# Patient Record
Sex: Female | Born: 1935 | Race: White | Hispanic: No | Marital: Married | Smoking: Never smoker
Health system: Southern US, Community
[De-identification: ages and names within clinical notes are randomized; demographics above are authoritative.]

## PROBLEM LIST (undated history)

## (undated) DIAGNOSIS — G9341 Metabolic encephalopathy: Secondary | ICD-10-CM

## (undated) DIAGNOSIS — I6930 Unspecified sequelae of cerebral infarction: Secondary | ICD-10-CM

## (undated) DIAGNOSIS — U071 COVID-19: Secondary | ICD-10-CM

## (undated) DIAGNOSIS — I69891 Dysphagia following other cerebrovascular disease: Secondary | ICD-10-CM

## (undated) DIAGNOSIS — I10 Essential (primary) hypertension: Secondary | ICD-10-CM

## (undated) DIAGNOSIS — N3090 Cystitis, unspecified without hematuria: Secondary | ICD-10-CM

## (undated) DIAGNOSIS — R2689 Other abnormalities of gait and mobility: Secondary | ICD-10-CM

## (undated) DIAGNOSIS — R2681 Unsteadiness on feet: Secondary | ICD-10-CM

## (undated) DIAGNOSIS — R278 Other lack of coordination: Secondary | ICD-10-CM

---

## 2019-10-14 ENCOUNTER — Inpatient Hospital Stay (HOSPITAL_COMMUNITY): Payer: Medicare PPO

## 2019-10-14 ENCOUNTER — Encounter (HOSPITAL_COMMUNITY): Payer: Self-pay | Admitting: *Deleted

## 2019-10-14 ENCOUNTER — Emergency Department (HOSPITAL_COMMUNITY): Payer: Medicare PPO

## 2019-10-14 ENCOUNTER — Inpatient Hospital Stay (HOSPITAL_COMMUNITY)
Admission: EM | Admit: 2019-10-14 | Discharge: 2019-10-19 | DRG: 177 | Disposition: A | Discharge status: Skilled Nursing Facility | Payer: Medicare PPO | Attending: Internal Medicine | Admitting: Internal Medicine

## 2019-10-14 DIAGNOSIS — Z7902 Long term (current) use of antithrombotics/antiplatelets: Secondary | ICD-10-CM

## 2019-10-14 DIAGNOSIS — F0151 Vascular dementia with behavioral disturbance: Secondary | ICD-10-CM | POA: Diagnosis not present

## 2019-10-14 DIAGNOSIS — Z66 Do not resuscitate: Secondary | ICD-10-CM | POA: Diagnosis present

## 2019-10-14 DIAGNOSIS — I69391 Dysphagia following cerebral infarction: Secondary | ICD-10-CM

## 2019-10-14 DIAGNOSIS — I69891 Dysphagia following other cerebrovascular disease: Secondary | ICD-10-CM | POA: Diagnosis not present

## 2019-10-14 DIAGNOSIS — B379 Candidiasis, unspecified: Secondary | ICD-10-CM | POA: Diagnosis not present

## 2019-10-14 DIAGNOSIS — J1282 Pneumonia due to coronavirus disease 2019: Secondary | ICD-10-CM | POA: Diagnosis present

## 2019-10-14 DIAGNOSIS — J189 Pneumonia, unspecified organism: Secondary | ICD-10-CM | POA: Diagnosis not present

## 2019-10-14 DIAGNOSIS — F0150 Vascular dementia without behavioral disturbance: Secondary | ICD-10-CM | POA: Diagnosis not present

## 2019-10-14 DIAGNOSIS — F0390 Unspecified dementia without behavioral disturbance: Secondary | ICD-10-CM | POA: Diagnosis present

## 2019-10-14 DIAGNOSIS — G9340 Encephalopathy, unspecified: Secondary | ICD-10-CM | POA: Diagnosis not present

## 2019-10-14 DIAGNOSIS — F0280 Dementia in other diseases classified elsewhere without behavioral disturbance: Secondary | ICD-10-CM | POA: Diagnosis present

## 2019-10-14 DIAGNOSIS — Z79899 Other long term (current) drug therapy: Secondary | ICD-10-CM | POA: Diagnosis not present

## 2019-10-14 DIAGNOSIS — Z792 Long term (current) use of antibiotics: Secondary | ICD-10-CM | POA: Diagnosis not present

## 2019-10-14 DIAGNOSIS — U071 COVID-19: Principal | ICD-10-CM | POA: Diagnosis present

## 2019-10-14 DIAGNOSIS — N179 Acute kidney failure, unspecified: Secondary | ICD-10-CM | POA: Diagnosis present

## 2019-10-14 DIAGNOSIS — G309 Alzheimer's disease, unspecified: Secondary | ICD-10-CM | POA: Diagnosis present

## 2019-10-14 DIAGNOSIS — J9601 Acute respiratory failure with hypoxia: Secondary | ICD-10-CM | POA: Diagnosis present

## 2019-10-14 DIAGNOSIS — Z515 Encounter for palliative care: Secondary | ICD-10-CM | POA: Diagnosis present

## 2019-10-14 DIAGNOSIS — R52 Pain, unspecified: Secondary | ICD-10-CM

## 2019-10-14 DIAGNOSIS — B961 Klebsiella pneumoniae [K. pneumoniae] as the cause of diseases classified elsewhere: Secondary | ICD-10-CM | POA: Diagnosis not present

## 2019-10-14 DIAGNOSIS — I69351 Hemiplegia and hemiparesis following cerebral infarction affecting right dominant side: Secondary | ICD-10-CM

## 2019-10-14 DIAGNOSIS — N184 Chronic kidney disease, stage 4 (severe): Secondary | ICD-10-CM | POA: Diagnosis present

## 2019-10-14 DIAGNOSIS — I129 Hypertensive chronic kidney disease with stage 1 through stage 4 chronic kidney disease, or unspecified chronic kidney disease: Secondary | ICD-10-CM | POA: Diagnosis present

## 2019-10-14 DIAGNOSIS — G9341 Metabolic encephalopathy: Secondary | ICD-10-CM | POA: Diagnosis present

## 2019-10-14 HISTORY — DX: Metabolic encephalopathy: G93.41

## 2019-10-14 HISTORY — DX: Dysphagia following other cerebrovascular disease: I69.891

## 2019-10-14 HISTORY — DX: Other abnormalities of gait and mobility: R26.89

## 2019-10-14 HISTORY — DX: Unspecified sequelae of cerebral infarction: I69.30

## 2019-10-14 HISTORY — DX: Cystitis, unspecified without hematuria: N30.90

## 2019-10-14 HISTORY — DX: Essential (primary) hypertension: I10

## 2019-10-14 HISTORY — DX: Other lack of coordination: R27.8

## 2019-10-14 HISTORY — DX: Unsteadiness on feet: R26.81

## 2019-10-14 HISTORY — DX: COVID-19: U07.1

## 2019-10-14 LAB — CBC WITH DIFFERENTIAL/PLATELET
Abs Immature Granulocytes: 0.03 10*3/uL (ref 0.00–0.07)
Basophils Absolute: 0 10*3/uL (ref 0.0–0.1)
Basophils Relative: 0 %
Eosinophils Absolute: 0 10*3/uL (ref 0.0–0.5)
Eosinophils Relative: 0 %
HCT: 37.2 % (ref 36.0–46.0)
Hemoglobin: 10.9 g/dL — ABNORMAL LOW (ref 12.0–15.0)
Immature Granulocytes: 1 %
Lymphocytes Relative: 8 %
Lymphs Abs: 0.5 10*3/uL — ABNORMAL LOW (ref 0.7–4.0)
MCH: 27.9 pg (ref 26.0–34.0)
MCHC: 29.3 g/dL — ABNORMAL LOW (ref 30.0–36.0)
MCV: 95.4 fL (ref 80.0–100.0)
Monocytes Absolute: 0.2 10*3/uL (ref 0.1–1.0)
Monocytes Relative: 3 %
Neutro Abs: 5.7 10*3/uL (ref 1.7–7.7)
Neutrophils Relative %: 88 %
Platelets: 218 10*3/uL (ref 150–400)
RBC: 3.9 MIL/uL (ref 3.87–5.11)
RDW: 14.3 % (ref 11.5–15.5)
WBC: 6.5 10*3/uL (ref 4.0–10.5)
nRBC: 0 % (ref 0.0–0.2)

## 2019-10-14 LAB — D-DIMER, QUANTITATIVE: D-Dimer, Quant: 2.52 ug/mL-FEU — ABNORMAL HIGH (ref 0.00–0.50)

## 2019-10-14 LAB — COMPREHENSIVE METABOLIC PANEL
ALT: 16 U/L (ref 0–44)
AST: 31 U/L (ref 15–41)
Albumin: 2.6 g/dL — ABNORMAL LOW (ref 3.5–5.0)
Alkaline Phosphatase: 72 U/L (ref 38–126)
Anion gap: 10 (ref 5–15)
BUN: 44 mg/dL — ABNORMAL HIGH (ref 8–23)
CO2: 25 mmol/L (ref 22–32)
Calcium: 8.8 mg/dL — ABNORMAL LOW (ref 8.9–10.3)
Chloride: 103 mmol/L (ref 98–111)
Creatinine, Ser: 1.97 mg/dL — ABNORMAL HIGH (ref 0.44–1.00)
GFR calc Af Amer: 25 mL/min — ABNORMAL LOW (ref >60)
GFR calc non Af Amer: 22 mL/min — ABNORMAL LOW (ref >60)
Glucose, Bld: 151 mg/dL — ABNORMAL HIGH (ref 70–99)
Potassium: 4.5 mmol/L (ref 3.5–5.1)
Sodium: 138 mmol/L (ref 135–145)
Total Bilirubin: 0.5 mg/dL (ref 0.3–1.2)
Total Protein: 6.6 g/dL (ref 6.5–8.1)

## 2019-10-14 LAB — C-REACTIVE PROTEIN: CRP: 2.6 mg/dL — ABNORMAL HIGH (ref <1.0)

## 2019-10-14 LAB — CBC
HCT: 35.8 % — ABNORMAL LOW (ref 36.0–46.0)
Hemoglobin: 10.5 g/dL — ABNORMAL LOW (ref 12.0–15.0)
MCH: 28.1 pg (ref 26.0–34.0)
MCHC: 29.3 g/dL — ABNORMAL LOW (ref 30.0–36.0)
MCV: 95.7 fL (ref 80.0–100.0)
Platelets: 198 10*3/uL (ref 150–400)
RBC: 3.74 MIL/uL — ABNORMAL LOW (ref 3.87–5.11)
RDW: 14.1 % (ref 11.5–15.5)
WBC: 4.3 10*3/uL (ref 4.0–10.5)
nRBC: 0 % (ref 0.0–0.2)

## 2019-10-14 LAB — FERRITIN: Ferritin: 150 ng/mL (ref 11–307)

## 2019-10-14 LAB — BLOOD GAS, ARTERIAL
Acid-base deficit: 0.2 mmol/L (ref 0.0–2.0)
Bicarbonate: 24.7 mmol/L (ref 20.0–28.0)
O2 Saturation: 99.2 %
Patient temperature: 98.6
pCO2 arterial: 44.5 mmHg (ref 32.0–48.0)
pH, Arterial: 7.364 (ref 7.350–7.450)
pO2, Arterial: 150 mmHg — ABNORMAL HIGH (ref 83.0–108.0)

## 2019-10-14 LAB — PROCALCITONIN: Procalcitonin: <0.1 ng/mL

## 2019-10-14 LAB — CREATININE, SERUM
Creatinine, Ser: 2.16 mg/dL — ABNORMAL HIGH (ref 0.44–1.00)
GFR calc Af Amer: 23 mL/min — ABNORMAL LOW (ref >60)
GFR calc non Af Amer: 20 mL/min — ABNORMAL LOW (ref >60)

## 2019-10-14 LAB — FIBRINOGEN: Fibrinogen: 552 mg/dL — ABNORMAL HIGH (ref 210–475)

## 2019-10-14 LAB — POC SARS CORONAVIRUS 2 AG -  ED: SARS Coronavirus 2 Ag: POSITIVE — AB

## 2019-10-14 LAB — LACTIC ACID, PLASMA: Lactic Acid, Venous: 1.5 mmol/L (ref 0.5–1.9)

## 2019-10-14 LAB — TRIGLYCERIDES: Triglycerides: 145 mg/dL (ref <150)

## 2019-10-14 LAB — LACTATE DEHYDROGENASE: LDH: 219 U/L — ABNORMAL HIGH (ref 98–192)

## 2019-10-14 MED ORDER — AZITHROMYCIN 250 MG PO TABS
500.0000 mg | ORAL_TABLET | Freq: Once | ORAL | Status: DC
  Filled 2019-10-14: qty 2

## 2019-10-14 MED ORDER — ACETAMINOPHEN 325 MG PO TABS: 650.0000 mg | ORAL_TABLET | Freq: Four times a day (QID) | ORAL | Status: DC | PRN

## 2019-10-14 MED ORDER — SODIUM CHLORIDE 0.9 % IV SOLN: 100.0000 mg | Freq: Every day | INTRAVENOUS | Status: DC

## 2019-10-14 MED ORDER — ALBUTEROL SULFATE HFA 108 (90 BASE) MCG/ACT IN AERS
2.0000 | INHALATION_SPRAY | Freq: Four times a day (QID) | RESPIRATORY_TRACT | Status: DC
  Administered 2019-10-14 – 2019-10-19 (×17): 2 via RESPIRATORY_TRACT
  Filled 2019-10-14 (×2): qty 6.7

## 2019-10-14 MED ORDER — ASCORBIC ACID 500 MG PO TABS
500.0000 mg | ORAL_TABLET | Freq: Every day | ORAL | Status: DC
  Administered 2019-10-14 – 2019-10-19 (×6): 500 mg via ORAL
  Filled 2019-10-14 (×6): qty 1

## 2019-10-14 MED ORDER — MIRTAZAPINE 15 MG PO TABS
7.5000 mg | ORAL_TABLET | Freq: Every day | ORAL | Status: DC
  Administered 2019-10-14 – 2019-10-18 (×5): 7.5 mg via ORAL
  Filled 2019-10-14 (×5): qty 1

## 2019-10-14 MED ORDER — DEXAMETHASONE SODIUM PHOSPHATE 10 MG/ML IJ SOLN
6.0000 mg | Freq: Once | INTRAMUSCULAR | Status: AC
  Administered 2019-10-14: 6 mg via INTRAVENOUS
  Filled 2019-10-14: qty 1

## 2019-10-14 MED ORDER — SODIUM CHLORIDE 0.9 % IV SOLN
1.0000 g | INTRAVENOUS | Status: DC
  Administered 2019-10-14 – 2019-10-16 (×3): 1 g via INTRAVENOUS
  Filled 2019-10-14 (×3): qty 10

## 2019-10-14 MED ORDER — DEXTROSE-NACL 5-0.45 % IV SOLN: INTRAVENOUS | Status: AC

## 2019-10-14 MED ORDER — DEXAMETHASONE SODIUM PHOSPHATE 10 MG/ML IJ SOLN
6.0000 mg | INTRAMUSCULAR | Status: DC
  Administered 2019-10-14 – 2019-10-18 (×5): 6 mg via INTRAVENOUS
  Filled 2019-10-14 (×5): qty 1

## 2019-10-14 MED ORDER — SODIUM CHLORIDE 0.9 % IV SOLN
1.0000 g | Freq: Once | INTRAVENOUS | Status: AC
  Administered 2019-10-14: 1 g via INTRAVENOUS
  Filled 2019-10-14: qty 10

## 2019-10-14 MED ORDER — ENOXAPARIN SODIUM 30 MG/0.3ML ~~LOC~~ SOLN
30.0000 mg | SUBCUTANEOUS | Status: DC
  Administered 2019-10-14 – 2019-10-16 (×3): 30 mg via SUBCUTANEOUS
  Filled 2019-10-14 (×3): qty 0.3

## 2019-10-14 MED ORDER — IOHEXOL 350 MG/ML SOLN: 100.0000 mL | Freq: Once | INTRAVENOUS | Status: DC | PRN

## 2019-10-14 MED ORDER — SODIUM CHLORIDE 0.9 % IV SOLN
100.0000 mg | Freq: Every day | INTRAVENOUS | Status: AC
  Administered 2019-10-15 – 2019-10-18 (×4): 100 mg via INTRAVENOUS
  Filled 2019-10-14: qty 100
  Filled 2019-10-14 (×3): qty 20

## 2019-10-14 MED ORDER — ZINC SULFATE 220 (50 ZN) MG PO CAPS
220.0000 mg | ORAL_CAPSULE | Freq: Every day | ORAL | Status: DC
  Administered 2019-10-14 – 2019-10-19 (×6): 220 mg via ORAL
  Filled 2019-10-14 (×6): qty 1

## 2019-10-14 MED ORDER — SODIUM CHLORIDE 0.9 % IV SOLN: 200.0000 mg | Freq: Once | INTRAVENOUS | Status: DC

## 2019-10-14 MED ORDER — SODIUM CHLORIDE 0.9 % IV SOLN
200.0000 mg | Freq: Once | INTRAVENOUS | Status: AC
  Administered 2019-10-14: 200 mg via INTRAVENOUS
  Filled 2019-10-14: qty 200

## 2019-10-14 MED ORDER — SERTRALINE HCL 50 MG PO TABS
50.0000 mg | ORAL_TABLET | Freq: Every day | ORAL | Status: DC
  Administered 2019-10-14 – 2019-10-19 (×6): 50 mg via ORAL
  Filled 2019-10-14 (×6): qty 1

## 2019-10-14 MED ORDER — SODIUM CHLORIDE 0.9 % IV SOLN
500.0000 mg | INTRAVENOUS | Status: DC
  Administered 2019-10-14 – 2019-10-16 (×3): 500 mg via INTRAVENOUS
  Filled 2019-10-14 (×3): qty 500

## 2019-10-14 NOTE — ED Notes (Signed)
[phone] daughter would like an update

## 2019-10-14 NOTE — H&P (Addendum)
History and Physical        Hospital Admission Note Date: 10/14/2019  Patient name: Tammy Wood Medical record number: [id number] Date of birth: 09/30/1926 Age: 86 y.o. Gender: female  PCP: Seward Carol, MD    Patient coming from: [facility] skilled nursing facility  I have reviewed all records in the [facility].    Chief Complaint:  Decreased oral intake, coughing, Covid positive  HPI: Patient is a 86 year old female with history of recent CVA in 07/2019 (left basal ganglia infarct, received TPA, admitted at [facility]) residual right-sided weakness, dysphagia, essential hypertension, arthritis, then had a UTI in 08/2019.  She has been at [facility] facility since 09/10/2019.  History was obtained from the patient's daughter.  Patient's daughter reported that patient got exposed and contracted Covid at the facility.  She did not have any fevers, loss of smell or taste.  She did have coughing and decreased p.o. intake.  Patient had not been eating or drinking much hence she was started on IV fluids at the facility yesterday.   Patient was sent to ED for Covid pneumonia. At the time of my encounter, somnolent, difficult to arouse.  Vital signs on the monitor stable. ABG showed pH of 7.3, PCO2 44.5, PO2 150  ED work-up/course:  In ED temp 98.6, respiratory rate 19, BP 110/46, O2 sats 96% on 2 L Sodium 138, potassium 4.5, creatinine 1.97, BUN 44 Creatinine 1.5 on 08/29/2019 per [facility] records  COVID-19 positive  Procalcitonin less than 0.1, CRP 2.6, ferritin 150, LDH 219, D-dimer 2.52, fibrinogen 552  Review of Systems: Positives marked in 'bold' Unable to obtain review of system from the patient due to her mental status, dementia  Past Medical History: Past Medical History:  Diagnosis Date  . Clinical diagnosis of severe  acute respiratory syndrome coronavirus 2 (SARS-CoV-2) disease   . Diverticulitis of bladder   . Dysphagia following other cerebrovascular disease   . Essential hypertension, malignant   . Metabolic encephalopathy   . Muscular incoordination   . Scissor gait   . Sequelae of cerebral infarction   . Unsteady     History reviewed. No pertinent surgical history.  Medications: Prior to Admission medications   Medication Sig Start Date End Date Taking? Authorizing Provider  acetaminophen (TYLENOL) 325 MG tablet Take 650 mg by mouth every 6 (six) hours as needed for mild pain.   Yes [provider]  albuterol (VENTOLIN HFA) 108 (90 Base) MCG/ACT inhaler Inhale 2 puffs into the lungs every 6 (six) hours as needed for wheezing or shortness of breath.   Yes [provider]  amLODipine (NORVASC) 10 MG tablet Take 10 mg by mouth daily.   Yes [provider]  ascorbic acid (VITAMIN C) 250 MG tablet Take 500 mg by mouth 2 (two) times daily.   Yes [provider]  carvedilol (COREG) 12.5 MG tablet Take 12.5 mg by mouth 2 (two) times daily with a meal.   Yes [provider]  Cholecalciferol (VITAMIN D3) 25 MCG (1000 UT) CAPS Take 1 capsule by mouth daily.   Yes [provider]  dexamethasone (DECADRON) 6 MG tablet Take  6 mg by mouth daily. x10days started 1.15.21   Yes [provider]  gabapentin (NEURONTIN) 100 MG capsule Take 100 mg by mouth daily.   Yes [provider]  Infant Care Products (DERMACLOUD) CREA Apply 1 application topically 2 (two) times daily. Apply to buttocks after incontinence episode.   Yes [provider]  mirtazapine (REMERON) 7.5 MG tablet Take 7.5 mg by mouth at bedtime.   Yes [provider]  ondansetron (ZOFRAN-ODT) 4 MG disintegrating tablet Take 4 mg by mouth every 6 (six) hours as needed for nausea or vomiting.   Yes [provider]  rosuvastatin (CRESTOR) 5 MG tablet Take 5 mg  by mouth daily.   Yes [provider]  sertraline (ZOLOFT) 50 MG tablet Take 50 mg by mouth daily.   Yes [provider]  zinc gluconate 50 MG tablet Take 100 mg by mouth daily.   Yes [provider]  azithromycin (ZITHROMAX) 250 MG tablet Take 250 mg by mouth daily. One time only.    [provider]  clopidogrel (PLAVIX) 75 MG tablet Take 75 mg by mouth daily.    [provider]    Allergies:   Allergies  Allergen Reactions  . Aspartame Tinitus    ARTIFICIAL SWEETNERS "CAUSE MY EARS TO ROAR" ARTIFICIAL SWEETNERS "CAUSE MY EARS TO ROAR" ARTIFICIAL SWEETNERS "CAUSE MY EARS TO ROAR" ARTIFICIAL SWEETNERS "CAUSE MY EARS TO ROAR"   . Aspirin Tinitus  . Chocolate Tinitus  . Cocoa Tinitus  . Atorvastatin Other (See Comments)  . Niacin Other (See Comments)    Other reaction(s): Unknown  . Sulfa Antibiotics     Other reaction(s): Unknown  . Sulfasalazine Other (See Comments)  . Amoxicillin-Pot Clavulanate Diarrhea  . Doxycycline Diarrhea and Nausea Only    Social History:  has no history on file for tobacco, alcohol, and drug.  Family History: History reviewed. No pertinent family history.  Physical Exam: Blood pressure (!) 110/46, pulse 61, temperature 98.6 F (37 C), temperature source Oral, resp. rate 19, SpO2 96 %. General: Somnolent, difficult to arouse Eyes: pink conjunctiva,anicteric sclera, PERLA, HEENT: normocephalic, atraumatic, oropharynx clear Neck: supple, no masses or lymphadenopathy, no goiter, no bruits, no JVD CVS: Regular rate and rhythm, without murmurs, rubs or gallops. No lower extremity edema Resp : Decreased breath sound at the bases GI : Soft, nontender, nondistended, positive bowel sounds, no masses. No hepatomegaly. No hernia.  Musculoskeletal: No clubbing or cyanosis, positive pedal pulses. No contracture. ROM intact  Neuro: Not following commands, somnolent Psych: Somnolent difficult to arouse Skin: no  rashes or lesions, warm and dry   LABS on Admission: I have personally reviewed all the labs and imagings below    Basic Metabolic Panel: Recent Labs  Lab 10/14/19 1237  NA 138  K 4.5  CL 103  CO2 25  GLUCOSE 151*  BUN 44*  CREATININE 1.97*  CALCIUM 8.8*   Liver Function Tests: Recent Labs  Lab 10/14/19 1237  AST 31  ALT 16  ALKPHOS 72  BILITOT 0.5  PROT 6.6  ALBUMIN 2.6*   No results for input(s): LIPASE, AMYLASE in the last 168 hours. No results for input(s): AMMONIA in the last 168 hours. CBC: Recent Labs  Lab 10/14/19 1237  WBC 6.5  NEUTROABS 5.7  HGB 10.9*  HCT 37.2  MCV 95.4  PLT 218   Cardiac Enzymes: No results for input(s): CKTOTAL, CKMB, CKMBINDEX, TROPONINI in the last 168 hours. BNP: Invalid input(s): POCBNP CBG: No results  for input(s): GLUCAP in the last 168 hours.  Radiological Exams on Admission:  CT HEAD WO CONTRAST  Result Date: 10/14/2019 CLINICAL DATA:  86 year old female with neurologic deficit. Concern for acute infarct. EXAM: CT HEAD WITHOUT CONTRAST TECHNIQUE: Contiguous axial images were obtained from the base of the skull through the vertex without intravenous contrast. COMPARISON:  None. FINDINGS: Brain: There is mild age-related atrophy and moderate chronic microvascular ischemic changes. An area of low attenuation in the left lentiform nucleus, likely chronic. Clinical correlation is recommended. Bilateral thalamic low attenuation with an area of decreased density along the inferior aspect of the left thalamus, age indeterminate, possibly chronic. Clinical correlation is recommended. MRI may provide better evaluation if there is high clinical concern for an acute infarct. There is no acute intracranial hemorrhage. No mass effect or midline shift. No extra-axial fluid collection. Vascular: No hyperdense vessel or unexpected calcification. Skull: Normal. Negative for fracture or focal lesion. Sinuses/Orbits: No acute finding. Other: None  IMPRESSION: 1. No acute intracranial hemorrhage. 2. Moderate chronic microvascular ischemic changes. Large left lentiform nucleus infarct, likely old. Clinical correlation is recommended. Electronically Signed   By: Anner Crete M.D.   On: 10/14/2019 16:04   DG Chest Port 1 View  Result Date: 10/14/2019 CLINICAL DATA:  Shortness of breath, cough. EXAM: PORTABLE CHEST 1 VIEW COMPARISON:  None. FINDINGS: The heart size and mediastinal contours are within normal limits. No pneumothorax or pleural effusion is noted. Mild patchy airspace opacities are noted in both lung bases concerning for multifocal pneumonia. The visualized skeletal structures are unremarkable. IMPRESSION: Mild patchy bibasilar airspace opacities are noted concerning for multifocal pneumonia. Electronically Signed   By: Marijo Conception M.D.   On: 10/14/2019 12:57      EKG: Independently reviewed.  Rate 63, normal sinus rhythm   Assessment/Plan Principal Problem:  Acute hypoxic respiratory failure due to acute COVID-19 viral pneumonia during the ongoing 2020 COVID-19 pandemic- POA - Patient presented with coughing, decreased oral intake, acute kidney injury, COVID-19 - Currently hypoxic, requiring 2 L nasal cannula -Started on Decadron 6 mg IV daily, Remdesivir per pharmacy protocol - Continue Supportive care: vitamin C/zinc, albuterol, Tylenol. - Continue to wean oxygen, ambulatory O2 screening daily as tolerated  - Oxygen - SpO2: 96 % O2 Flow Rate (L/min): 2 L/min - Continue to follow labs as below  No results found for: [place]  Lab 10/14/19 1237  DDIMER 2.52*  FERRITIN 150  CRP 2.6*  ALT 16  PROCALCITON <0.10      Active Problems:   Acute encephalopathy with underlying history of recent acute CVA, right-sided hemiparesis, dysphagia -At the time of my examination, somnolent, difficult to arouse -ABG did not show any respiratory acidosis, follow UA and culture -CT head showed no acute  intracranial hemorrhage, moderate chronic microvascular ischemic changes, large left lentiform nucleus infarct, likely old   Dysphagia -Poor oral intake in the last week, likely due to Covid, also has residual dysphagia from recent stroke -Per daughter, patient is on pure diet, honey thick -Given her mental status currently, will hold diet, SLP evaluation once alert and awake. -Placed on gentle IV fluid hydration  Acute kidney injury superimposed on CKD stage IV -Creatinine 1.5 on 08/29/19 per [facility] records -Placed on IV fluid hydration  DVT prophylaxis: Lovenox  CODE STATUS: DNR/DNI, per transfer records  Consults called: None  Family Communication: Admission, patients condition and plan of care including tests being ordered have been discussed with the patient's  daughter who indicates understanding and agree with the plan and Code Status  Admission status: Inpatient stepdown  The medical decision making on this patient was of high complexity and the patient is at high risk for clinical deterioration, therefore this is a level 3 admission.  Severity of Illness:      The appropriate patient status for this patient is INPATIENT. Inpatient status is judged to be reasonable and necessary in order to provide the required intensity of service to ensure the patient's safety. The patient's presenting symptoms, physical exam findings, and initial radiographic and laboratory data in the context of their chronic comorbidities is felt to place them at high risk for further clinical deterioration. Furthermore, it is not anticipated that the patient will be medically stable for discharge from the hospital within 2 midnights of admission. The following factors support the patient status of inpatient.   " The patient's presenting symptoms include altered mental status, acute kidney injury, Covid multifocal pneumonia, decreased oral intake " The worrisome physical exam findings include somnolent,  difficult to arouse " The initial radiographic and laboratory data are worrisome because of acute kidney injury " The chronic co-morbidities include recent CVA, residual hemiparesis, dysphagia   * I certify that at the point of admission it is my clinical judgment that the patient will require inpatient hospital care spanning beyond 2 midnights from the point of admission due to high intensity of service, high risk for further deterioration and high frequency of surveillance required.*    Time Spent on Admission: 70-minutes     Calhoun Reichardt M.D. [facility] 10/14/2019, 4:40 PM

## 2019-10-14 NOTE — ED Triage Notes (Signed)
Per EMS, pt from [facility] sent here for COVID PNA. She is at baseline, has no complaints. Pt was normal sinus rhythm on monitor. Hcx of stroke, has right sided deficits.

## 2019-10-14 NOTE — ED Provider Notes (Signed)
[facility] DEPT Provider Note   CSN: [id number] Arrival date & time: 10/14/19  1147     History   Tammy Wood is a 86 y.o. female.  HPI   Patient was sent to the ED for evaluation of respiratory difficulties associated with Covid infection.  Patient has history of stroke and right-sided deficits at baseline.  According to the EMS report the patient is a resident at [facility] facility.  She has been diagnosed with Covid pneumonia.  Family members wanted her sent to the ED for further treatment.  Patient is able to answer questions but is somewhat difficult to understand.  She denies having any chest pain.  She denies having any fevers.  Past Medical History:  Diagnosis Date  . Clinical diagnosis of severe acute respiratory syndrome coronavirus 2 (SARS-CoV-2) disease   . Diverticulitis of bladder   . Dysphagia following other cerebrovascular disease   . Essential hypertension, malignant   . Metabolic encephalopathy   . Muscular incoordination   . Scissor gait   . Sequelae of cerebral infarction   . Unsteady     There are no problems to display for this patient.   History reviewed. No pertinent surgical history.   OB History   No obstetric history on file.     History reviewed. No pertinent family history.  Social History   Tobacco Use  . Smoking status: Not on file  Substance Use Topics  . Alcohol use: Not on file  . Drug use: Not on file    Home Medications Prior to Admission medications   Medication Sig Start Date End Date Taking? Authorizing Provider  acetaminophen (TYLENOL) 325 MG tablet Take 650 mg by mouth every 6 (six) hours as needed for mild pain.   Yes [provider]  albuterol (VENTOLIN HFA) 108 (90 Base) MCG/ACT inhaler Inhale 2 puffs into the lungs every 6 (six) hours as needed for wheezing or shortness of breath.   Yes [provider]  amLODipine (NORVASC) 10 MG tablet Take 10 mg by mouth  daily.   Yes [provider]  ascorbic acid (VITAMIN C) 250 MG tablet Take 500 mg by mouth 2 (two) times daily.   Yes [provider]  carvedilol (COREG) 12.5 MG tablet Take 12.5 mg by mouth 2 (two) times daily with a meal.   Yes [provider]  Cholecalciferol (VITAMIN D3) 25 MCG (1000 UT) CAPS Take 1 capsule by mouth daily.   Yes [provider]  dexamethasone (DECADRON) 6 MG tablet Take 6 mg by mouth daily. x10days started 1.15.21   Yes [provider]  gabapentin (NEURONTIN) 100 MG capsule Take 100 mg by mouth daily.   Yes [provider]  Infant Care Products (DERMACLOUD) CREA Apply 1 application topically 2 (two) times daily. Apply to buttocks after incontinence episode.   Yes [provider]  mirtazapine (REMERON) 7.5 MG tablet Take 7.5 mg by mouth at bedtime.   Yes [provider]  ondansetron (ZOFRAN-ODT) 4 MG disintegrating tablet Take 4 mg by mouth every 6 (six) hours as needed for nausea or vomiting.   Yes [provider]  rosuvastatin (CRESTOR) 5 MG tablet Take 5 mg by mouth daily.   Yes [provider]  sertraline (ZOLOFT) 50 MG tablet Take 50 mg by mouth daily.   Yes [provider]  zinc gluconate 50 MG tablet Take 100 mg by mouth daily.   Yes [provider]  azithromycin (ZITHROMAX) 250 MG  tablet Take 250 mg by mouth daily. One time only.    [provider]  clopidogrel (PLAVIX) 75 MG tablet Take 75 mg by mouth daily.    [provider]    Allergies    Aspartame, Aspirin, Chocolate, Cocoa, Atorvastatin, Niacin, Sulfa antibiotics, Sulfasalazine, Amoxicillin-pot clavulanate, and Doxycycline  Review of Systems   Review of Systems  All other systems reviewed and are negative.   Physical Exam Updated Vital Signs BP (!) 110/46   Pulse 61   Temp 98.6 F (37 C) (Oral)   Resp 19   SpO2 96%   Physical Exam Vitals and nursing note reviewed.   Constitutional:      General: She is not in acute distress.    Appearance: She is well-developed.     Comments: Elderly, frail  HENT:     Head: Normocephalic and atraumatic.     Right Ear: External ear normal.     Left Ear: External ear normal.  Eyes:     General: No scleral icterus.       Right eye: No discharge.        Left eye: No discharge.     Conjunctiva/sclera: Conjunctivae normal.  Neck:     Trachea: No tracheal deviation.  Cardiovascular:     Rate and Rhythm: Normal rate and regular rhythm.  Pulmonary:     Effort: Pulmonary effort is normal. No respiratory distress.     Breath sounds: Normal breath sounds. No stridor. No wheezing or rales.  Abdominal:     General: Bowel sounds are normal. There is no distension.     Palpations: Abdomen is soft.     Tenderness: There is no abdominal tenderness. There is no guarding or rebound.  Musculoskeletal:        General: No tenderness.     Cervical back: Neck supple.     Right lower leg: No edema.     Left lower leg: No edema.  Skin:    General: Skin is warm and dry.     Findings: No rash.  Neurological:     Mental Status: She is alert.     Cranial Nerves: No cranial nerve deficit (no facial droop, extraocular movements intact, dysarthria).     Sensory: Sensory deficit present.     Motor: No abnormal muscle tone or seizure activity.     Coordination: Coordination normal.     Comments: Right-sided hemiparesis     ED Results / Procedures / Treatments   Labs (all labs ordered are listed, but only abnormal results are displayed) Labs Reviewed  CBC WITH DIFFERENTIAL/PLATELET - Abnormal; Notable for the following components:      Result Value   Hemoglobin 10.9 (*)    MCHC 29.3 (*)    Lymphs Abs 0.5 (*)    All other components within normal limits  COMPREHENSIVE METABOLIC PANEL - Abnormal; Notable for the following components:   Glucose, Bld 151 (*)    BUN 44 (*)    Creatinine, Ser 1.97 (*)    Calcium 8.8 (*)     Albumin 2.6 (*)    GFR calc non Af Amer 22 (*)    GFR calc Af Amer 25 (*)    All other components within normal limits  D-DIMER, QUANTITATIVE (NOT AT [facility]) - Abnormal; Notable for the following components:   D-Dimer, Quant 2.52 (*)    All other components within normal limits  LACTATE DEHYDROGENASE - Abnormal; Notable for the following components:   LDH 219 (*)  All other components within normal limits  FIBRINOGEN - Abnormal; Notable for the following components:   Fibrinogen 552 (*)    All other components within normal limits  C-REACTIVE PROTEIN - Abnormal; Notable for the following components:   CRP 2.6 (*)    All other components within normal limits  CULTURE, BLOOD (ROUTINE X 2)  CULTURE, BLOOD (ROUTINE X 2)  LACTIC ACID, PLASMA  PROCALCITONIN  FERRITIN  TRIGLYCERIDES    EKG EKG Interpretation  Date/Time:  Saturday October 14 2019 12:09:07 EST Ventricular Rate:  63 PR Interval:    QRS Duration: 77 QT Interval:  399 QTC Calculation: 409 R Axis:   -20 Text Interpretation: Sinus rhythm Borderline left axis deviation No old tracing to compare Artifact Confirmed by Dorie Rank [phone]) on 10/14/2019 12:17:05 PM   Radiology DG Chest Port 1 View  Result Date: 10/14/2019 CLINICAL DATA:  Shortness of breath, cough. EXAM: PORTABLE CHEST 1 VIEW COMPARISON:  None. FINDINGS: The heart size and mediastinal contours are within normal limits. No pneumothorax or pleural effusion is noted. Mild patchy airspace opacities are noted in both lung bases concerning for multifocal pneumonia. The visualized skeletal structures are unremarkable. IMPRESSION: Mild patchy bibasilar airspace opacities are noted concerning for multifocal pneumonia. Electronically Signed   By: Marijo Conception M.D.   On: 10/14/2019 12:57    Procedures .Critical Care Performed by: Dorie Rank, MD Authorized by: Dorie Rank, MD   Critical care provider statement:    Critical care time (minutes):  45   Critical care  was time spent personally by me on the following activities:  Discussions with consultants, evaluation of patient's response to treatment, examination of patient, ordering and performing treatments and interventions, ordering and review of laboratory studies, ordering and review of radiographic studies, pulse oximetry, re-evaluation of patient's condition, obtaining history from patient or surrogate and review of old charts   (including critical care time)  Medications Ordered in ED Medications  dexamethasone (DECADRON) injection 6 mg (has no administration in time range)  cefTRIAXone (ROCEPHIN) 1 g in sodium chloride 0.9 % 100 mL IVPB (has no administration in time range)  azithromycin (ZITHROMAX) tablet 500 mg (has no administration in time range)  dexamethasone (DECADRON) injection 6 mg (6 mg Intravenous Given 10/14/19 1256)    ED Course  I have reviewed the triage vital signs and the nursing notes.  Pertinent labs & imaging results that were available during my care of the patient were reviewed by me and considered in my medical decision making (see chart for details).  Clinical Course as of Oct 13 1438  Sat Oct 14, 2019  1413 Labs reviewed.  Patient has elevated D-dimer fibrinogen CRP consistent with her Covid diagnosis.   [JK]  1413 Increased BUN and creatinine.  No prior labs available in our system.   [JK]  1413 X-rays consistent with multifocal pneumonia.   [JK]    Clinical Course User Index [JK] Dorie Rank, MD   MDM Rules/Calculators/A&P                      Patient presents to the emergency room for evaluation of respiratory difficulties in the setting of Covid 19 diagnosis at the nursing facility.  Patient's x-ray does show multifocal pneumonia.  She remains hemodynamically stable but she does have an oxygen requirement.  Patient was given a dose of IV Decadron.  I have started on Rocephin and azithromycin until we can get confirmation of her COVID-19 diagnosis.  Plan  admission to the hospital for further treatment. Final Clinical Impression(s) / ED Diagnoses Final diagnoses:  Pneumonia due to COVID-19 virus     Dorie Rank, MD 10/14/19 [phone]

## 2019-10-15 ENCOUNTER — Inpatient Hospital Stay (HOSPITAL_COMMUNITY): Payer: Medicare PPO

## 2019-10-15 ENCOUNTER — Encounter (HOSPITAL_COMMUNITY): Payer: Self-pay | Admitting: Internal Medicine

## 2019-10-15 ENCOUNTER — Other Ambulatory Visit: Payer: Self-pay

## 2019-10-15 LAB — CBC WITH DIFFERENTIAL/PLATELET
Abs Immature Granulocytes: 0.02 10*3/uL (ref 0.00–0.07)
Basophils Absolute: 0 10*3/uL (ref 0.0–0.1)
Basophils Relative: 0 %
Eosinophils Absolute: 0 10*3/uL (ref 0.0–0.5)
Eosinophils Relative: 0 %
HCT: 34.7 % — ABNORMAL LOW (ref 36.0–46.0)
Hemoglobin: 10.2 g/dL — ABNORMAL LOW (ref 12.0–15.0)
Immature Granulocytes: 0 %
Lymphocytes Relative: 12 %
Lymphs Abs: 0.6 10*3/uL — ABNORMAL LOW (ref 0.7–4.0)
MCH: 27.9 pg (ref 26.0–34.0)
MCHC: 29.4 g/dL — ABNORMAL LOW (ref 30.0–36.0)
MCV: 94.8 fL (ref 80.0–100.0)
Monocytes Absolute: 0.2 10*3/uL (ref 0.1–1.0)
Monocytes Relative: 4 %
Neutro Abs: 4.4 10*3/uL (ref 1.7–7.7)
Neutrophils Relative %: 84 %
Platelets: 227 10*3/uL (ref 150–400)
RBC: 3.66 MIL/uL — ABNORMAL LOW (ref 3.87–5.11)
RDW: 14.1 % (ref 11.5–15.5)
WBC: 5.3 10*3/uL (ref 4.0–10.5)
nRBC: 0 % (ref 0.0–0.2)

## 2019-10-15 LAB — C-REACTIVE PROTEIN: CRP: 2.5 mg/dL — ABNORMAL HIGH (ref <1.0)

## 2019-10-15 LAB — URINALYSIS, ROUTINE W REFLEX MICROSCOPIC
Bilirubin Urine: NEGATIVE
Glucose, UA: NEGATIVE mg/dL
Hgb urine dipstick: NEGATIVE
Ketones, ur: 5 mg/dL — AB
Nitrite: NEGATIVE
Protein, ur: 100 mg/dL — AB
Specific Gravity, Urine: 1.017 (ref 1.005–1.030)
WBC, UA: >50 WBC/hpf — ABNORMAL HIGH (ref 0–5)
pH: 5 (ref 5.0–8.0)

## 2019-10-15 LAB — MRSA PCR SCREENING: MRSA by PCR: NEGATIVE

## 2019-10-15 LAB — ABO/RH: ABO/RH(D): A POS

## 2019-10-15 LAB — FERRITIN: Ferritin: 129 ng/mL (ref 11–307)

## 2019-10-15 LAB — D-DIMER, QUANTITATIVE: D-Dimer, Quant: 2.46 ug/mL-FEU — ABNORMAL HIGH (ref 0.00–0.50)

## 2019-10-15 NOTE — Progress Notes (Signed)
Progress Note        PROGRESS NOTE    Geneive Kawahara  [id number] DOB: [birth year] DOA: 10/14/2019 PCP: Seward Carol, MD    Brief Narrative:  86 year old female with history of recent CVA in 07/2019 (left basal ganglia infarct, received TPA, admitted at [facility]) residual right-sided weakness, dysphagia, essential hypertension, arthritis, then had a UTI in 08/2019.  She has been at [place] facility since 09/10/2019.  History was obtained from the patient's daughter.  Patient's daughter reported that patient got exposed and contracted Covid at the facility.  She did not have any fevers, loss of smell or taste.  She did have coughing and decreased p.o. intake.  Patient had not been eating or drinking much hence she was started on IV fluids at the facility yesterday.   Patient was sent to ED for Covid pneumonia. At the time of my encounter, somnolent, difficult to arouse.  Vital signs on the monitor stable. ABG showed pH of 7.3, PCO2 44.5, PO2 150 Please see H&P for full details  Assessment & Plan:   Principal Problem:   Multifocal pneumonia Active Problems:   Acute encephalopathy   Dementia ([place])  Clinical problems list 1.  Acute hypoxic respiratory failure secondary to multifocal pneumonia due to COVID-19 virus infection 2.  Acute encephalopathy 3.  Dementia, Alzheimer type 4.  History of CVA with right hemiparesis 5.  Dysphagia 6.  Acute kidney injury superimposed on chronic kidney disease stage IV  1.  Acute hypoxic respiratory failure secondary to multifocal pneumonia due to COVID-19 virus infection.  Patient requiring increasing oxygen to 5 L. Remdesivir started along with dexamethasone and supportive therapy with vitamin C, D and zinc Oxygen supplementation as needed Continue with bronchodilators Patient is scheduled for transfer to [facility]  2.  Acute encephalopathy in the setting of hypoxia, COVID-19 virus  infection/multifocal pneumonia versus dementia. Continue with treatment as above and monitor for improvement in mental status  3.  Dementia, Alzheimer type Continue with conservative treatment  4.  History of CVA with right hemiparesis.  Recent CVA in November 2020-left basal ganglia infarct and status post TPA. Continue with secondary stroke prevention with aspirin/Plavix and statin  5.  Dysphagia, likely due to previous CVA Speech and swallow evaluation prior to initiation of oral intake. Continue with IV fluid normal saline to alternate with D5 saline, pending oral intake.  6.  Acute kidney injury superimposed on chronic kidney disease stage IV. Baseline creatinine about 1.5-2.0, Serum creatinine admission was 1.97 and up trended to 2.16.  Possibly prerenal secondary to dehydration versus acute renal failure syndrome of COVID-19 virus infection. Monitor renal function Avoid nephrotoxic agents    DVT prophylaxis: Lovenox subcute Code Status: DNR  Family Communication: None at bedside Disposition Plan: Patient is being transferred to [facility]-increased oxygen requirement to 5 L   Consultants:   None  Procedures: None  Antimicrobials: Ceftriaxone Azithromycin  Subjective: Patient was seen and examined at bedside.  On my respiratory distress with 5 L O2 by nasal cannula.  Patient is alert but mildly disoriented, possibly dementia versus encephalopathy. She has been picked up by [facility] for transfer to..  Objective: Vitals:   10/15/19 0730 10/15/19 1000 10/15/19 1016 10/15/19 1121  BP: 123/63 (!) 119/52  (!) 117/50  Pulse: 63 63  66  Resp: 16 15  15   Temp:    98.1 F (36.7 C)  TempSrc:    Oral  SpO2: 100% 100% 99% 96%  Weight:        Intake/Output Summary (Last 24 hours) at 10/15/2019 1148 Last data filed at 10/15/2019 0604 Gross per 24 hour  Intake 600 ml  Output 350 ml  Net 250 ml   Filed Weights   10/15/19 0421  Weight: 65.8 kg     Examination:  General exam: Appears calm and comfortable  Respiratory system: Clear to auscultation. Respiratory effort normal. Cardiovascular system: S1 & S2 heard, RRR. No JVD, murmurs, rubs, gallops or clicks. No pedal edema. Gastrointestinal system: Abdomen is nondistended, soft and nontender. No organomegaly or masses felt. Normal bowel sounds heard. Central nervous system: Alert and oriented. No focal neurological deficits. Extremities: Symmetric 5 x 5 power. Skin: No rashes, lesions or ulcers Psychiatry: Judgement and insight appear normal. Mood & affect appropriate.     Data Reviewed: I have personally reviewed following labs and imaging studies  CBC: Recent Labs  Lab 10/14/19 1237 10/14/19 1959 10/15/19 0803  WBC 6.5 4.3 5.3  NEUTROABS 5.7  --  4.4  HGB 10.9* 10.5* 10.2*  HCT 37.2 35.8* 34.7*  MCV 95.4 95.7 94.8  PLT 218 198 [id number]   Basic Metabolic Panel: Recent Labs  Lab 10/14/19 1237 10/14/19 1959  NA 138  --   K 4.5  --   CL 103  --   CO2 25  --   GLUCOSE 151*  --   BUN 44*  --   CREATININE 1.97* 2.16*  CALCIUM 8.8*  --    GFR: CrCl cannot be calculated (Unknown ideal weight.). Liver Function Tests: Recent Labs  Lab 10/14/19 1237  AST 31  ALT 16  ALKPHOS 72  BILITOT 0.5  PROT 6.6  ALBUMIN 2.6*   No results for input(s): LIPASE, AMYLASE in the last 168 hours. No results for input(s): AMMONIA in the last 168 hours. Coagulation Profile: No results for input(s): INR, PROTIME in the last 168 hours. Cardiac Enzymes: No results for input(s): CKTOTAL, CKMB, CKMBINDEX, TROPONINI in the last 168 hours. BNP (last 3 results) No results for input(s): PROBNP in the last 8760 hours. HbA1C: No results for input(s): HGBA1C in the last 72 hours. CBG: No results for input(s): GLUCAP in the last 168 hours. Lipid Profile: Recent Labs    10/14/19 1237  TRIG 145   Thyroid Function Tests: No results for input(s): TSH, T4TOTAL, FREET4, T3FREE, THYROIDAB in  the last 72 hours. Anemia Panel: Recent Labs    10/14/19 1237 10/15/19 0803  FERRITIN 150 129   Sepsis Labs: Recent Labs  Lab 10/14/19 1237  PROCALCITON <0.10  LATICACIDVEN 1.5    Recent Results (from the past 240 hour(s))  Blood Culture (routine x 2)     Status: None (Preliminary result)   Collection Time: 10/14/19 12:32 PM   Specimen: BLOOD LEFT HAND  Result Value Ref Range Status   Specimen Description   Final    BLOOD LEFT HAND Performed at [facility] [address] [place], [place] [address]    Special Requests   Final    BOTTLES DRAWN AEROBIC AND ANAEROBIC Blood Culture adequate volume Performed at [facility] [address] [place], [place] [address]    Culture   Final    NO GROWTH < 12 HOURS Performed at [facility] [address] [place], [place] [address]    Report Status PENDING  Incomplete  Blood Culture (routine x 2)     Status: None (Preliminary result)   Collection Time: 10/14/19 12:37  PM   Specimen: BLOOD  Result Value Ref Range Status   Specimen Description   Final    BLOOD LEFT ANTECUBITAL Performed at [facility] [address], [place], [place] [address]    Special Requests   Final    BOTTLES DRAWN AEROBIC AND ANAEROBIC Blood Culture adequate volume Performed at [facility] [address], [place], [place] [address]    Culture   Final    NO GROWTH < 12 HOURS Performed at [facility] [address] [place], [place] [address]    Report Status PENDING  Incomplete         Radiology Studies: CT HEAD WO CONTRAST  Result Date: 10/14/2019 CLINICAL DATA:  86 year old female with neurologic deficit. Concern for acute infarct. EXAM: CT HEAD WITHOUT CONTRAST TECHNIQUE: Contiguous axial images were obtained from the base of the skull through the vertex without intravenous contrast. COMPARISON:  None. FINDINGS: Brain: There is mild age-related  atrophy and moderate chronic microvascular ischemic changes. An area of low attenuation in the left lentiform nucleus, likely chronic. Clinical correlation is recommended. Bilateral thalamic low attenuation with an area of decreased density along the inferior aspect of the left thalamus, age indeterminate, possibly chronic. Clinical correlation is recommended. MRI may provide better evaluation if there is high clinical concern for an acute infarct. There is no acute intracranial hemorrhage. No mass effect or midline shift. No extra-axial fluid collection. Vascular: No hyperdense vessel or unexpected calcification. Skull: Normal. Negative for fracture or focal lesion. Sinuses/Orbits: No acute finding. Other: None IMPRESSION: 1. No acute intracranial hemorrhage. 2. Moderate chronic microvascular ischemic changes. Large left lentiform nucleus infarct, likely old. Clinical correlation is recommended. Electronically Signed   By: Anner Crete M.D.   On: 10/14/2019 16:04   DG Chest Port 1 View  Result Date: 10/14/2019 CLINICAL DATA:  Shortness of breath, cough. EXAM: PORTABLE CHEST 1 VIEW COMPARISON:  None. FINDINGS: The heart size and mediastinal contours are within normal limits. No pneumothorax or pleural effusion is noted. Mild patchy airspace opacities are noted in both lung bases concerning for multifocal pneumonia. The visualized skeletal structures are unremarkable. IMPRESSION: Mild patchy bibasilar airspace opacities are noted concerning for multifocal pneumonia. Electronically Signed   By: Marijo Conception M.D.   On: 10/14/2019 12:57        Scheduled Meds: . albuterol  2 puff Inhalation Q6H  . vitamin C  500 mg Oral Daily  . dexamethasone (DECADRON) injection  6 mg Intravenous Q24H  . enoxaparin (LOVENOX) injection  30 mg Subcutaneous Q24H  . mirtazapine  7.5 mg Oral QHS  . sertraline  50 mg Oral Daily  . zinc sulfate  220 mg Oral Daily   Continuous Infusions: . azithromycin Stopped  (10/15/19 0604)  . cefTRIAXone (ROCEPHIN)  IV Stopped (10/15/19 0604)  . dextrose 5 % and 0.45% NaCl 75 mL/hr at 10/14/19 1852  . remdesivir 100 mg in NS 100 mL 100 mg (10/15/19 1016)     LOS: 1 day    Time spent: [place], MD [facility] Pager [phone]   If 7PM-7AM, please contact night-coverage [email or web address] Password [facility] 10/15/2019, 11:48 AM

## 2019-10-15 NOTE — Progress Notes (Signed)
PT A/O X2.  Pleasantly confused.  Currently on 2L [place].  Vitals WNL.  Pt asking for water, assisted her with a few ice chips.  Pt was able to speak with family on phone.  Pt was able to take night medications without difficulty crushed in pudding.  Per family, pt was not eating at facility however liked pudding and grape jello.    Family mentioned they are keeping up with patient via notes in chart.  Will make sure to update regularly.    Pt is now resting comfortably,  Will continue to monitor.

## 2019-10-15 NOTE — Plan of Care (Signed)
Pt transferred from [facility] for treatment today at 1140. Patient A&Ox 2-3. Vitals stable on 2L [place]. Family updated on care plan. Will continue to monitor.

## 2019-10-15 NOTE — Evaluation (Signed)
Clinical/Bedside Swallow Evaluation Patient Details  Name: Ovelia Stoy MRN: [id number] Date of Birth: [birth year]  Today's Date: 10/15/2019 Time: SLP Start Time (ACUTE ONLY): 40 SLP Stop Time (ACUTE ONLY): 1526 SLP Time Calculation (min) (ACUTE ONLY): 16 min  Past Medical History:  Past Medical History:  Diagnosis Date  . Clinical diagnosis of severe acute respiratory syndrome coronavirus 2 (SARS-CoV-2) disease   . Diverticulitis of bladder   . Dysphagia following other cerebrovascular disease   . Essential hypertension, malignant   . Metabolic encephalopathy   . Muscular incoordination   . Scissor gait   . Sequelae of cerebral infarction   . Unsteady    Past Surgical History: History reviewed. No pertinent surgical history. HPI:  Patient is a 86 year old female with history of recent CVA in 07/2019 (left basal ganglia infarct, received TPA, admitted at [facility]) residual right-sided weakness, dysphagia, essential hypertension, arthritis, then had a UTI in 08/2019.  She has been at [place] facility since 09/10/2019.  History was obtained from the patient's daughter.  Patient's daughter reported that patient got exposed and contracted Covid at the facility.  She did not have any fevers, loss of smell or taste.  She did have coughing and decreased p.o. intake.  Patient had not been eating or drinking much hence she was started on IV fluids at the facility.  MBS on 08/28/19 at [facility] reported: "moderate oropharyngeal dysphagia. Patient with cord level penetration of thin liquids, and stagnant penetration of nectar and honey thick liquids x1. No aspiration of trialed consistencies (thin liquid, nectar thick liquid, honey thick liquid, puree, ground) observed during today's evaluation."  SLP recommended Dysphagia 1 (puree) solids and honey-thick liquids.  Pt with hx of mild expressive and receptive aphasia.   Assessment / Plan /  Recommendation Clinical Impression  Pt was seen for a bedside swallow evaluation and she presents with oral dysphagia and suspected pharyngeal dysphagia.  Pt has a known hx of oropharyngeal dysphagia with most recent MBS on 08/28/19 at [facility]) recommending Dysphagia 1 (puree) solids and honey-thick liquids.  Pt reported that she has been consuming this diet without difficulty at the SNF since her discharge from [facility].  Oral mechanism exam was remarkable for lingual weakness and reduced labial ROM on the R side (likely residual from CVA).  Her oral cavity was observed to be dry with dried secretions present on her labial surface.  She consumed trials of ice chips, thin liquid, nectar-thick liquid, and puree during this evaluation.  She exhibited good labial closure and bolus acceptance with prolonged AP transport and suspected delayed swallow initiation with all trials.  No overt s/sx of aspiration were observed with ice chips or tsp of thin liquid; however, she exhibited an immediate cough or throat clear with straw sips of thin liquid, nectar-thick liquid, honey-thick liquid, and puree.  Small bites/sips did not eliminate s/sx of aspiration.  Pt additionally reported globus sensation with puree trials which cleared with a liquid wash.  Pt would benefit from an instrumental swallow study to further evaluate swallow function.  Recommend continuation of NPO with frequent oral care and ice chips PRN for comfort and to help keep oral cavity moistened following thorough oral care.  Recommend that medications be administered via alternative means; however, would suggest that essential PO medications are crushed in puree if necessary.    SLP Visit Diagnosis: Dysphagia, oropharyngeal phase (R13.12)    Aspiration Risk  Moderate aspiration risk  Diet Recommendation NPO;Ice chips PRN after oral care   Medication Administration: Via alternative means Postural Changes: Seated upright at 90 degrees     Other  Recommendations Oral Care Recommendations: Oral care QID;Oral care prior to ice chip/H20;Staff/trained caregiver to provide oral care   Follow up Recommendations Skilled Nursing facility      Frequency and Duration min 2x/week  2 weeks       Prognosis Prognosis for Safe Diet Advancement: Fair Barriers to Reach Goals: Language deficits      Swallow Study   General HPI: Patient is a 86 year old female with history of recent CVA in 07/2019 (left basal ganglia infarct, received TPA, admitted at [facility]) residual right-sided weakness, dysphagia, essential hypertension, arthritis, then had a UTI in 08/2019.  She has been at [place] facility since 09/10/2019.  History was obtained from the patient's daughter.  Patient's daughter reported that patient got exposed and contracted Covid at the facility.  She did not have any fevers, loss of smell or taste.  She did have coughing and decreased p.o. intake.  Patient had not been eating or drinking much hence she was started on IV fluids at the facility.  MBS on 08/28/19 at [facility] reported: "moderate oropharyngeal dysphagia. Patient with cord level penetration of thin liquids, and stagnant penetration of nectar and honey thick liquids x1. No aspiration of trialed consistencies (thin liquid, nectar thick liquid, honey thick liquid, puree, ground) observed during today's evaluation." and SLP recommended Dysphagia 1 (puree) solids and honey-thick liquids.  Pt with hx of mild expressive and receptive aphasia. Type of Study: Bedside Swallow Evaluation Previous Swallow Assessment: See HPI  Diet Prior to this Study: NPO Temperature Spikes Noted: No Respiratory Status: Nasal cannula History of Recent Intubation: No Behavior/Cognition: Alert;Cooperative;Pleasant mood Oral Cavity Assessment: Dry;Dried secretions Oral Care Completed by SLP: No Oral Cavity - Dentition: Other (Comment)(Partial dentures  ) Vision: Functional for self-feeding Self-Feeding Abilities: Needs assist;Needs set up Patient Positioning: Upright in bed Baseline Vocal Quality: Low vocal intensity Volitional Cough: Weak Volitional Swallow: Able to elicit    Oral/Motor/Sensory Function Overall Oral Motor/Sensory Function: Mild impairment Facial ROM: Reduced right Facial Symmetry: Within Functional Limits Facial Sensation: Within Functional Limits Lingual ROM: Reduced right Lingual Symmetry: Within Functional Limits Lingual Strength: Reduced   Ice Chips Ice chips: Impaired Presentation: Spoon Oral Phase Impairments: Impaired mastication Oral Phase Functional Implications: Prolonged oral transit   Thin Liquid Thin Liquid: Impaired Presentation: Spoon;Straw Oral Phase Functional Implications: Prolonged oral transit Pharyngeal  Phase Impairments: Throat Clearing - Immediate;Cough - Immediate    Nectar Thick Nectar Thick Liquid: Impaired Presentation: Straw Oral phase functional implications: Prolonged oral transit Pharyngeal Phase Impairments: Throat Clearing - Immediate   Honey Thick Honey Thick Liquid: Impaired Presentation: Straw Oral Phase Functional Implications: Prolonged oral transit Pharyngeal Phase Impairments: Cough - Immediate;Throat Clearing - Immediate   Puree Puree: Impaired Presentation: Spoon Oral Phase Impairments: Reduced lingual movement/coordination Oral Phase Functional Implications: Prolonged oral transit Pharyngeal Phase Impairments: Throat Clearing - Immediate;Throat Clearing - Delayed   Solid     Solid: Not tested     Colin Mulders M.S., CCC-SLP Acute Rehabilitation Services Office: [phone]  [place] 10/15/2019,3:44 PM

## 2019-10-15 NOTE — ED Notes (Signed)
Blood draw attempted x2, unsuccessful.

## 2019-10-15 NOTE — ED Notes (Addendum)
[facility] dispatch notified for need of transport and was advised it would be after 0700 for transport.

## 2019-10-15 NOTE — ED Notes (Signed)
Lab contacted to draw morning labs. This RN and Nurse Burton Apley attempted x 2 .

## 2019-10-15 NOTE — ED Notes (Signed)
[facility] to pick up patient, noticed patient right shoulder deformed. No previous injury documented including no falls. Dr Stark Jock called, [facility] reported that they would take patient to [facility] in which they would inform the nurse there.

## 2019-10-15 NOTE — Progress Notes (Signed)
   10/15/19 1418  Family/Significant Other Communication  Family/Significant Other Update Updated (spoke with h usband Jeneen Rinks and son w/update. )  Updated spouse Jeneen Rinks and son with update of room # and plan of care. Patient spoke with husband.

## 2019-10-15 NOTE — ED Notes (Signed)
Attempted to call report x 1. Receiving RN will call back.

## 2019-10-15 NOTE — ED Notes (Addendum)
Pt checked for urine specimen, but was found to have a bowel movement. Pt was cleaned and barrier cream was applied due to skin irritation. Pt's bladder was scanned and found to have about 336mL. Joi, RN was made aware. Pt was provided with a fresh brief and purewick following in and out cath.

## 2019-10-16 DIAGNOSIS — U071 COVID-19: Principal | ICD-10-CM

## 2019-10-16 DIAGNOSIS — Z515 Encounter for palliative care: Secondary | ICD-10-CM

## 2019-10-16 DIAGNOSIS — J1282 Pneumonia due to coronavirus disease 2019: Secondary | ICD-10-CM

## 2019-10-16 LAB — CBC WITH DIFFERENTIAL/PLATELET
Abs Immature Granulocytes: 0.04 10*3/uL (ref 0.00–0.07)
Basophils Absolute: 0 10*3/uL (ref 0.0–0.1)
Basophils Relative: 0 %
Eosinophils Absolute: 0 10*3/uL (ref 0.0–0.5)
Eosinophils Relative: 0 %
HCT: 30.2 % — ABNORMAL LOW (ref 36.0–46.0)
Hemoglobin: 9.3 g/dL — ABNORMAL LOW (ref 12.0–15.0)
Immature Granulocytes: 1 %
Lymphocytes Relative: 10 %
Lymphs Abs: 0.7 10*3/uL (ref 0.7–4.0)
MCH: 28.5 pg (ref 26.0–34.0)
MCHC: 30.8 g/dL (ref 30.0–36.0)
MCV: 92.6 fL (ref 80.0–100.0)
Monocytes Absolute: 0.3 10*3/uL (ref 0.1–1.0)
Monocytes Relative: 4 %
Neutro Abs: 6 10*3/uL (ref 1.7–7.7)
Neutrophils Relative %: 85 %
Platelets: 258 10*3/uL (ref 150–400)
RBC: 3.26 MIL/uL — ABNORMAL LOW (ref 3.87–5.11)
RDW: 14.5 % (ref 11.5–15.5)
WBC: 7.1 10*3/uL (ref 4.0–10.5)
nRBC: 0 % (ref 0.0–0.2)

## 2019-10-16 LAB — FERRITIN: Ferritin: 109 ng/mL (ref 11–307)

## 2019-10-16 LAB — COMPREHENSIVE METABOLIC PANEL
ALT: 15 U/L (ref 0–44)
AST: 26 U/L (ref 15–41)
Albumin: 2.1 g/dL — ABNORMAL LOW (ref 3.5–5.0)
Alkaline Phosphatase: 56 U/L (ref 38–126)
Anion gap: 9 (ref 5–15)
BUN: 62 mg/dL — ABNORMAL HIGH (ref 8–23)
CO2: 23 mmol/L (ref 22–32)
Calcium: 8.5 mg/dL — ABNORMAL LOW (ref 8.9–10.3)
Chloride: 106 mmol/L (ref 98–111)
Creatinine, Ser: 2.06 mg/dL — ABNORMAL HIGH (ref 0.44–1.00)
GFR calc Af Amer: 24 mL/min — ABNORMAL LOW (ref >60)
GFR calc non Af Amer: 21 mL/min — ABNORMAL LOW (ref >60)
Glucose, Bld: 203 mg/dL — ABNORMAL HIGH (ref 70–99)
Potassium: 4.4 mmol/L (ref 3.5–5.1)
Sodium: 138 mmol/L (ref 135–145)
Total Bilirubin: 0.6 mg/dL (ref 0.3–1.2)
Total Protein: 5.2 g/dL — ABNORMAL LOW (ref 6.5–8.1)

## 2019-10-16 LAB — GLUCOSE, CAPILLARY
Glucose-Capillary: 174 mg/dL — ABNORMAL HIGH (ref 70–99)
Glucose-Capillary: 176 mg/dL — ABNORMAL HIGH (ref 70–99)
Glucose-Capillary: 172 mg/dL — ABNORMAL HIGH (ref 70–99)
Glucose-Capillary: 204 mg/dL — ABNORMAL HIGH (ref 70–99)

## 2019-10-16 LAB — C-REACTIVE PROTEIN: CRP: 1.4 mg/dL — ABNORMAL HIGH (ref <1.0)

## 2019-10-16 LAB — D-DIMER, QUANTITATIVE: D-Dimer, Quant: 1.89 ug/mL-FEU — ABNORMAL HIGH (ref 0.00–0.50)

## 2019-10-16 MED ORDER — RESOURCE THICKENUP CLEAR PO POWD
ORAL | Status: DC | PRN
  Filled 2019-10-16: qty 125

## 2019-10-16 MED ORDER — ENSURE ENLIVE PO LIQD
237.0000 mL | Freq: Two times a day (BID) | ORAL | Status: DC
  Administered 2019-10-16 – 2019-10-18 (×4): 237 mL via ORAL

## 2019-10-16 MED ORDER — SODIUM CHLORIDE 0.9 % IV SOLN: INTRAVENOUS | Status: DC

## 2019-10-16 NOTE — TOC Initial Note (Addendum)
Transition of Care [facility]) - Initial/Assessment Note    Patient Details  Name: Tammy Wood MRN: [id number] Date of Birth: April 29, 1927  Transition of Care [facility]) CM/SW Contact:    Shade Flood, LCSW Phone Number: 10/16/2019, 10:23 AM  Clinical Narrative:                  Pt admitted from [facility] SNF.Spoke with pt's daughter, Butch Penny, for assessment. Per Butch Penny, pt has been at [facility] since mid December. She initially went there for rehab after having a CVA. She has since transitioned into long term care. Per Butch Penny, they had been planning to bring pt home a week ago this past Friday but then she was diagnosed with Covid. Butch Penny is anticipating that pt will need to return to [facility] at dc due to being weak from Covid. Butch Penny asks that she be the family member contacted for all dc planning needs.  Contacted Janie at [employer] to update. She states that pt can return at dc if that is what family wants.  Palliative Care APNP recommending outpatient Palliative at dc. TOC will refer prior to dc.  TOC will follow and continue to assess and assist as needed.  Expected Discharge Plan: Skilled Nursing Facility Barriers to Discharge: Continued Medical Work up   Patient Goals and CMS Choice        Expected Discharge Plan and Services Expected Discharge Plan: [place] In-house Referral: Clinical Social Work     Living arrangements for the past 2 months: [place]                                      Prior Living Arrangements/Services Living arrangements for the past 2 months: [place] Lives with:: Spouse, Facility Resident Patient language and need for interpreter reviewed:: Yes        Need for Family Participation in Patient Care: Yes (Comment) Care giver support system in place?: Yes (comment)   Criminal Activity/Legal Involvement Pertinent to Current Situation/Hospitalization: No - Comment as needed  Activities  of Daily Living Home Assistive Devices/Equipment: [employer] fast streamer ADL Screening (condition at time of admission) Patient's cognitive ability adequate to safely complete daily activities?: No Is the patient deaf or have difficulty hearing?: No Does the patient have difficulty seeing, even when wearing glasses/contacts?: No Does the patient have difficulty concentrating, remembering, or making decisions?: Yes Patient able to express need for assistance with ADLs?: Yes Does the patient have difficulty dressing or bathing?: Yes Independently performs ADLs?: No Communication: Needs assistance Is this a change from baseline?: Pre-admission baseline Dressing (OT): Dependent Is this a change from baseline?: Pre-admission baseline Grooming: Dependent Is this a change from baseline?: Pre-admission baseline Feeding: Dependent Is this a change from baseline?: Pre-admission baseline Bathing: Dependent Is this a change from baseline?: Pre-admission baseline Toileting: Dependent Is this a change from baseline?: Pre-admission baseline In/Out Bed: Dependent Is this a change from baseline?: Pre-admission baseline Walks in Home: Dependent Is this a change from baseline?: Pre-admission baseline Does the patient have difficulty walking or climbing stairs?: Yes Weakness of Legs: Both Weakness of Arms/Hands: Right  Permission Sought/Granted                  Emotional Assessment       Orientation: : Oriented to Self Alcohol / Substance Use: Not Applicable Psych Involvement: No (comment)  Admission diagnosis:  Acute encephalopathy [G93.40] Multifocal pneumonia [J18.9]  Pneumonia due to COVID-19 virus [U07.1, J12.82] Patient Active Problem List   Diagnosis Date Noted  . Multifocal pneumonia 10/14/2019  . Acute encephalopathy 10/14/2019  . Dementia ([place]) 10/14/2019   PCP:  Seward Carol, MD Pharmacy:  No Pharmacies Listed    Social Determinants of Health (SDOH) Interventions     Readmission Risk Interventions Readmission Risk Prevention Plan 10/16/2019  Transportation Screening Complete  Home Care Screening Not Complete  Home Care Screening Not Completed Comments Anticipating dc to SNF  Medication Review (RN CM) Complete  Some recent data might be hidden

## 2019-10-16 NOTE — Evaluation (Signed)
Physical Therapy Evaluation Patient Details Name: Tammy Wood MRN: [id number] DOB: [birth year] Today's Date: 10/16/2019   History of Present Illness  86 year old female with history of recent CVA in 07/2019 (left basal ganglia infarct, received TPA, admitted at [facility]) residual right-sided weakness, dysphagia, essential hypertension, arthritis, then had a UTI in 08/2019.  She has been at [place] facility since 09/10/2019.Patient was sent to ED for Covid pneumonia.  Clinical Impression   The patient comes from SNF post acute stroke, presents with flaccid L extremities , indicates  pain  To move RUE and when right ankle moved.. The patient was nearing Dc to  Daughter' s home when COVID was contracted. Plans are to return to SNF . Pt admitted with above diagnosis.  Pt currently with functional limitations due to the deficits listed below (see PT Problem List). Pt will benefit from skilled PT to increase their independence and safety with mobility to allow discharge to the venue listed below.      Follow Up Recommendations SNF(return  until can Dc home)    Equipment Recommendations  Hospital bed(mechanical lift, WC with cushion)    Recommendations for Other Services   OT    Precautions / Restrictions Precautions Precautions: Fall Precaution Comments: dense right hemi, painfail RUE and R LE/ankle      Mobility  Bed Mobility Overal bed mobility: Needs Assistance Bed Mobility: Rolling Rolling: Mod assist;Max assist         General bed mobility comments: mod to roll to the right,  and total to roll to the left, requires support of right leg, did flex left to roll to the right. Transfers                    Ambulation/Gait                Stairs            Wheelchair Mobility    Modified Rankin (Stroke Patients Only)       Balance                                             Pertinent  Vitals/Pain Pain Assessment: Faces Faces Pain Scale: Hurts even more Pain Location: RU/LE when repositioned Pain Intervention(s): Monitored during session;Repositioned    Home Living Family/patient expects to be discharged to:: Skilled nursing facility                 Additional Comments: was soon scheduled toDC to daughtr's home with hospital bed and lift.    Prior Function Level of Independence: Needs assistance         Comments: total care     Hand Dominance   Dominant Hand: Right    Extremity/Trunk Assessment        Lower Extremity Assessment Lower Extremity Assessment: RLE deficits/detail;LLE deficits/detail RLE Deficits / Details: flaccid, painful to dorsiflex foot LLE Deficits / Details: flexes leg to roll,       Communication   Communication: Expressive difficulties  Cognition Arousal/Alertness: Awake/alert Behavior During Therapy: WFL for tasks assessed/performed Overall Cognitive Status: Impaired/Different from baseline Area of Impairment: Orientation;Following commands                 Orientation Level: Time;Situation     Following Commands: Follows one step commands inconsistently  General Comments      Exercises     Assessment/Plan    PT Assessment Patient needs continued PT services  PT Problem List Decreased strength;Decreased mobility;Decreased safety awareness;Decreased knowledge of precautions;Decreased range of motion;Decreased activity tolerance;Decreased cognition;Cardiopulmonary status limiting activity;Decreased balance;Impaired sensation;Pain;Impaired tone       PT Treatment Interventions Therapeutic activities;Therapeutic exercise;Patient/family education;Functional mobility training;Neuromuscular re-education    PT Goals (Current goals can be found in the Care Plan section)  Acute Rehab PT Goals Patient Stated Goal: to improve to go to daughter's home PT Goal Formulation: With  patient/family Time For Goal Achievement: 10/30/19 Potential to Achieve Goals: Fair    Frequency Min 2X/week   Barriers to discharge        Co-evaluation               AM-PAC PT "6 Clicks" Mobility  Outcome Measure Help needed turning from your back to your side while in a flat bed without using bedrails?: Total Help needed moving from lying on your back to sitting on the side of a flat bed without using bedrails?: Total Help needed moving to and from a bed to a chair (including a wheelchair)?: Total Help needed standing up from a chair using your arms (e.g., wheelchair or bedside chair)?: Total Help needed to walk in hospital room?: Total Help needed climbing 3-5 steps with a railing? : Total 6 Click Score: 6    End of Session   Activity Tolerance: Patient tolerated treatment well Patient left: in chair;with call bell/phone within reach Nurse Communication: Mobility status;Need for lift equipment PT Visit Diagnosis: Other symptoms and signs involving the nervous system (R29.898);Hemiplegia and hemiparesis Hemiplegia - Right/Left: Right Hemiplegia - dominant/non-dominant: Dominant Hemiplegia - caused by: Other cerebrovascular disease    Time: 1130-1215 PT Time Calculation (min) (ACUTE ONLY): 45 min   Charges:   PT Evaluation $PT Eval Moderate Complexity: 1 Mod PT Treatments $Therapeutic Activity: 8-22 mins        Tresa Endo PT Acute Rehabilitation Services Pager [phone] Office [phone]   Claretha Cooper 10/16/2019, 1:31 PM

## 2019-10-16 NOTE — Progress Notes (Signed)
Progress Note        PROGRESS NOTE    Tammy Wood  [id number] DOB: Jan 07, 1927 DOA: 10/14/2019 PCP: Seward Carol, MD    Brief Narrative:  86 year old female with history of recent CVA in 07/2019 (left basal ganglia infarct, received TPA, admitted at [facility] residual right-sided weakness, dysphagia, essential hypertension, arthritis, then had a UTI in 08/2019.  She has been at [facility] facility since 09/10/2019.  History was obtained from the patient's daughter.  Patient's daughter reported that patient got exposed and contracted Covid at the facility.  She did not have any fevers, loss of smell or taste.  She did have coughing and decreased p.o. intake.  Patient had not been eating or drinking much hence she was started on IV fluids at the facility yesterday.   Patient was sent to ED for Covid pneumonia. At the time of my encounter, somnolent, difficult to arouse.  Vital signs on the monitor stable. ABG showed pH of 7.3, PCO2 44.5, PO2 150 Please see H&P for full details  Subjective:  Significant events as discussed with staff, patient herself denies any complaints, but she is poor historian  Assessment & Plan:   Principal Problem:   Multifocal pneumonia Active Problems:   Acute encephalopathy   Dementia ([place])   Pneumonia due to COVID-19 virus   Palliative care encounter   Acute hypoxic respiratory failure secondary to multifocal pneumonia due to COVID-19 virus infection -Presentation she did require 5 L, this appears to be improving, currently she is between room air and 2 L. -Continue with IV remdesivir. -Continue with IV Decadron -Patient was encouraged with incentive spirometry and flutter valve, staff will keep encouraging her as well. -Continue supportive therapy with vitamin C, D and zinc  Acute encephalopathy - due to  hypoxia, COVID-19 infection.  History of CVA with right hemiparesis.   - Recent CVA in November 2020-left  basal ganglia infarct and status post TPA. - Continue with secondary stroke prevention with aspirin/Plavix and statin  Dysphagia, likely due to previous CVA - seen by SLP -Discussed with daughter, will consider on Remeron if her appetite is poor(she is just started on dysphagia 1 today)  Acute kidney injury superimposed on chronic kidney disease stage IV. - Baseline creatinine about 1.5-2.0, Serum creatinine admission was 1.97 and up trended to 2.16.  Possibly prerenal secondary to dehydration versus acute renal failure syndrome of COVID-19 virus infection. Monitor renal function Avoid nephrotoxic agents    DVT prophylaxis: Lovenox subcute Code Status: DNR  Family Communication: D/W daughter Butch Penny Disposition Plan: Patient is being transferred to [facility]-increased oxygen requirement to 5 L   Consultants:   palliative  Procedures: None  Antimicrobials: Ceftriaxone Azithromycin    Objective: Vitals:   10/16/19 0100 10/16/19 0400 10/16/19 0600 10/16/19 0800  BP:  140/89  (!) 128/58  Pulse: 74 78  (!) 59  Resp: 14 16  19   Temp:  98.3 F (36.8 C)  98.3 F (36.8 C)  TempSrc:  Oral  Oral  SpO2: 97% 98% 91% 92%  Weight:      Height:        Intake/Output Summary (Last 24 hours) at 10/16/2019 1536 Last data filed at 10/15/2019 2200 Gross per 24 hour  Intake 1718.12 ml  Output 160 ml  Net 1558.12 ml   Filed Weights   10/15/19 0421  Weight: 65.8 kg    Examination:  Awake Alert,frail , present, answering questions appropriately, but easily distracted Symmetrical Chest  wall movement, Good air movement bilaterally, CTAB RRR,No Gallops,Rubs or new Murmurs, No Parasternal Heave +ve B.Sounds, Abd Soft, No tenderness, No rebound - guarding or rigidity. No Cyanosis, Clubbing or edema, dense right side weakness, with trace edema.     Data Reviewed: I have personally reviewed following labs and imaging studies  CBC: Recent Labs  Lab 10/14/19 1237  10/14/19 1959 10/15/19 0803 10/16/19 0423  WBC 6.5 4.3 5.3 7.1  NEUTROABS 5.7  --  4.4 6.0  HGB 10.9* 10.5* 10.2* 9.3*  HCT 37.2 35.8* 34.7* 30.2*  MCV 95.4 95.7 94.8 92.6  PLT 218 198 227 0000000   Basic Metabolic Panel: Recent Labs  Lab 10/14/19 1237 10/14/19 1959 10/16/19 0423  NA 138  --  138  K 4.5  --  4.4  CL 103  --  106  CO2 25  --  23  GLUCOSE 151*  --  203*  BUN 44*  --  62*  CREATININE 1.97* 2.16* 2.06*  CALCIUM 8.8*  --  8.5*   GFR: Estimated Creatinine Clearance: 16.2 mL/min (A) (by C-G formula based on SCr of 2.06 mg/dL (H)). Liver Function Tests: Recent Labs  Lab 10/14/19 1237 10/16/19 0423  AST 31 26  ALT 16 15  ALKPHOS 72 56  BILITOT 0.5 0.6  PROT 6.6 5.2*  ALBUMIN 2.6* 2.1*   No results for input(s): LIPASE, AMYLASE in the last 168 hours. No results for input(s): AMMONIA in the last 168 hours. Coagulation Profile: No results for input(s): INR, PROTIME in the last 168 hours. Cardiac Enzymes: No results for input(s): CKTOTAL, CKMB, CKMBINDEX, TROPONINI in the last 168 hours. BNP (last 3 results) No results for input(s): PROBNP in the last 8760 hours. HbA1C: No results for input(s): HGBA1C in the last 72 hours. CBG: Recent Labs  Lab 10/16/19 0840 10/16/19 1227  GLUCAP 174* 176*   Lipid Profile: Recent Labs    10/14/19 1237  TRIG 145   Thyroid Function Tests: No results for input(s): TSH, T4TOTAL, FREET4, T3FREE, THYROIDAB in the last 72 hours. Anemia Panel: Recent Labs    10/15/19 0803 10/16/19 0423  FERRITIN 129 109   Sepsis Labs: Recent Labs  Lab 10/14/19 1237  PROCALCITON <0.10  LATICACIDVEN 1.5    Recent Results (from the past 240 hour(s))  Blood Culture (routine x 2)     Status: None (Preliminary result)   Collection Time: 10/14/19 12:32 PM   Specimen: BLOOD LEFT HAND  Result Value Ref Range Status   Specimen Description   Final    BLOOD LEFT HAND Performed at [facility] [address]  [place], [place] [address]    Special Requests   Final    BOTTLES DRAWN AEROBIC AND ANAEROBIC Blood Culture adequate volume Performed at [facility] [address], [place], [place] [address]    Culture   Final    NO GROWTH 2 DAYS Performed at [facility] [address], [place], [place] [address]    Report Status PENDING  Incomplete  Blood Culture (routine x 2)     Status: None (Preliminary result)   Collection Time: 10/14/19 12:37 PM   Specimen: BLOOD  Result Value Ref Range Status   Specimen Description   Final    BLOOD LEFT ANTECUBITAL Performed at [facility] [address] [place], [place] [address]    Special Requests   Final    BOTTLES DRAWN AEROBIC AND ANAEROBIC Blood Culture adequate volume Performed at  [facility], [place] [address], [place], [place] [address]    Culture   Final    NO GROWTH 2 DAYS Performed at [facility] [address], [place], [place] [address]    Report Status PENDING  Incomplete  Urine culture     Status: Abnormal (Preliminary result)   Collection Time: 10/14/19  4:41 PM   Specimen: Urine, Random  Result Value Ref Range Status   Specimen Description   Final    URINE, RANDOM Performed at [facility] [address], [place], [place] [address]    Special Requests   Final    NONE Performed at [facility], [place] [address] [place], [place] [address]    Culture (A)  Final    >=100,000 COLONIES/mL GRAM NEGATIVE RODS IDENTIFICATION AND SUSCEPTIBILITIES TO FOLLOW CULTURE REINCUBATED FOR BETTER GROWTH Performed at [facility], [place] [address] [place], [place] [address]    Report Status PENDING  Incomplete  MRSA PCR Screening     Status: None   Collection Time: 10/15/19  1:10 PM   Specimen: Nasal Mucosa; Nasopharyngeal  Result Value Ref Range Status   MRSA by PCR NEGATIVE NEGATIVE Final    Comment:        The GeneXpert MRSA  Assay (FDA approved for NASAL specimens only), is one component of a comprehensive MRSA colonization surveillance program. It is not intended to diagnose MRSA infection nor to guide or monitor treatment for MRSA infections. Performed at [facility], [place] [address], [place],  [address]          Radiology Studies: CT HEAD WO CONTRAST  Result Date: 10/14/2019 CLINICAL DATA:  86 year old female with neurologic deficit. Concern for acute infarct. EXAM: CT HEAD WITHOUT CONTRAST TECHNIQUE: Contiguous axial images were obtained from the base of the skull through the vertex without intravenous contrast. COMPARISON:  None. FINDINGS: Brain: There is mild age-related atrophy and moderate chronic microvascular ischemic changes. An area of low attenuation in the left lentiform nucleus, likely chronic. Clinical correlation is recommended. Bilateral thalamic low attenuation with an area of decreased density along the inferior aspect of the left thalamus, age indeterminate, possibly chronic. Clinical correlation is recommended. MRI may provide better evaluation if there is high clinical concern for an acute infarct. There is no acute intracranial hemorrhage. No mass effect or midline shift. No extra-axial fluid collection. Vascular: No hyperdense vessel or unexpected calcification. Skull: Normal. Negative for fracture or focal lesion. Sinuses/Orbits: No acute finding. Other: None IMPRESSION: 1. No acute intracranial hemorrhage. 2. Moderate chronic microvascular ischemic changes. Large left lentiform nucleus infarct, likely old. Clinical correlation is recommended. Electronically Signed   By: Anner Crete M.D.   On: 10/14/2019 16:04   DG Shoulder Right Port  Result Date: 10/15/2019 CLINICAL DATA:  Right shoulder pain. No known injury. EXAM: PORTABLE RIGHT SHOULDER COMPARISON:  None. FINDINGS: Mild AC joint and glenohumeral joint degenerative changes. No dislocation. No acute bony  findings or bone lesion. No abnormal soft tissue calcifications. The visualized lung is clear and the visualized ribs are intact. IMPRESSION: Mild degenerative changes but no acute bony findings. Electronically Signed   By: Marijo Sanes M.D.   On: 10/15/2019 15:18        Scheduled Meds: . albuterol  2 puff Inhalation Q6H  . vitamin C  500 mg Oral Daily  . dexamethasone (DECADRON) injection  6 mg Intravenous Q24H  . enoxaparin (LOVENOX) injection  30 mg  Subcutaneous Q24H  . feeding supplement (ENSURE ENLIVE)  237 mL Oral BID BM  . mirtazapine  7.5 mg Oral QHS  . sertraline  50 mg Oral Daily  . zinc sulfate  220 mg Oral Daily   Continuous Infusions: . azithromycin Stopped (10/15/19 2144)  . cefTRIAXone (ROCEPHIN)  IV Stopped (10/15/19 2023)  . remdesivir 100 mg in NS 100 mL 100 mg (10/16/19 0844)     LOS: 2 days      Phillips Climes, MD [facility]  If 7PM-7AM, please contact night-coverage [email or web address] Password [facility] 10/16/2019, 3:36 PM

## 2019-10-16 NOTE — Consult Note (Addendum)
Consultation Note Date: 10/16/2019   Patient Name: Tammy Wood  DOB: 1927/04/28  MRN: [id number]  Age / Sex: 86 y.o., female  PCP: Seward Carol, MD Referring Physician: Albertine Patricia, MD  Reason for Consultation: Establishing goals of care and Psychosocial/spiritual support  HPI/Patient Profile: 86 y.o. 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  She was admitted and treated with antibiotics, steroids, and antiviral medication.  She failed her initial SLP evaluation and was n.p.o.  Today on a follow-up SLP evaluation she was started on a dysphagia 1 diet with honey thick liquids (as she had been on previously at [facility]).  Palliative medicine was consulted for goals of care, communication, and family support.   Clinical Assessment and Goals of Care:  I have reviewed medical records including EPIC notes, labs and imaging, received report from Dr. Waldron Labs, and the night RN, spoke on the phone with the patient's daughter Tammy Wood (medical decision making surrogate) to discuss diagnosis prognosis, GOC, disposition and options.  I introduced Palliative Medicine as specialized medical care for people living with serious illness. It focuses on providing relief from the symptoms and stress of a serious illness.   We discussed a brief life review of the patient.  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  She married the school principal.  They had 4 children, and 8 grandchildren as well as many Designer, industrial/product.  [name] is described as the Runell Gess of the family.  As far as functional and nutritional status prior to August 20, 2019 [name] and her husband lived in independent  living.  [name] showered dressed and appeared ready to go out every morning.  She did the book keeping, managing 3 checkbooks accurately to the cent.  She and her husband had aids to cook their meals.  Just prior to being diagnosed with Covid the family was preparing to bring her home.  They have arranged 24-hour care for she and her husband.  They have a hospital bed and Hoyer lift in place at home.  We discussed her current illness and what it means in the larger context of her on-going co-morbidities.  We briefly discussed aspiration pneumonia.  Tammy Wood seems aware of this possibility given her dysphagia.  We also discussed hospital delirium and possible underlying dementia.  Tammy Wood explained that with each change in environment her mother would have episodes of delirium (I have been kidnapped or another distressing delusion).  I attempted to elicit values and goals of care important to the patient.  It is important to the patient and her family to bring her home to her familiar environment.  Advanced directives, concepts specific to code status, artifical feeding and hydration, and rehospitalization were considered and discussed.  [name] is a DNR.  I spoke with her daughter briefly about artificial nutrition and hydration.  Tammy Wood felt the family would  support a temporary nasal feeding tube as a trial for nutrition but would not support a long-term permanent feeding tube.  We both hope that feeding tubes will not be necessary.  I think [name] would benefit from outpatient palliative care follow-up.  The family seems to have excellent support in place to support her needs.  Questions and concerns were addressed.  The family was encouraged to call with questions or concerns.    Primary Decision Maker: Daughter Tammy Wood    SUMMARY OF RECOMMENDATIONS    It appears that discharged back to [facility] is being discussed however if possible the family is prepared to take [name] home.  My  feeling is that home would be the best environment for her given her episodes of delirium associated with changing rooms/facilities.  No permanent feeding tube, but family is open to a short-term trial of artificial nutrition via NG if necessary.  Recommend outpatient palliative care follow-up at SNF or at home.  Palliative medicine will continue to chart check for change in status and will reengage if there is acute decline.  If we can be of assistance sooner please do not hesitate to call [phone].   Code Status/Advance Care Planning:  DNR  Symptom Management:   Delirium precautions  Additional Recommendations (Limitations, Scope, Preferences):  Full Scope Treatment  Psycho-social/Spiritual:   Desire for further Chaplaincy support: Not discussed  Prognosis:   Unable to determine.  Unfortunately given the unpredictability of Covid pneumonia and the patient's previous CVA with residual effects she is at high risk for acute decline, rehospitalization or even death.  Discharge Planning: To Be Determined SNF with palliative versus home with home health and palliative      Primary Diagnoses: Present on Admission: . Multifocal pneumonia . Acute encephalopathy . Dementia ([place])   I have reviewed the medical record, interviewed the patient and family, and examined the patient. The following aspects are pertinent.  Past Medical History:  Diagnosis Date  . Clinical diagnosis of severe acute respiratory syndrome coronavirus 2 (SARS-CoV-2) disease   . Diverticulitis of bladder   . Dysphagia following other cerebrovascular disease   . Essential hypertension, malignant   . Metabolic encephalopathy   . Muscular incoordination   . Scissor gait   . Sequelae of cerebral infarction   . Unsteady    Social History   Socioeconomic History  . Marital status: Married    Spouse name: Not on file  . Number of children: Not on file  . Years of education: Not on file  . Highest  education level: Not on file  Occupational History  . Not on file  Tobacco Use  . Smoking status: Never Smoker  . Smokeless tobacco: Never Used  Substance and Sexual Activity  . Alcohol use: Not Currently  . Drug use: Never  . Sexual activity: Not Currently  Other Topics Concern  . Not on file  Social History Narrative  . Not on file   Social Determinants of Health   Financial Resource Strain:   . Difficulty of Paying Living Expenses: Not on file  Food Insecurity:   . Worried About Charity fundraiser in the Last Year: Not on file  . Ran Out of Food in the Last Year: Not on file  Transportation Needs:   . Lack of Transportation (Medical): Not on file  . Lack of Transportation (Non-Medical): Not on file  Physical Activity:   . Days of Exercise per Week: Not on file  . Minutes of  Exercise per Session: Not on file  Stress:   . Feeling of Stress : Not on file  Social Connections:   . Frequency of Communication with Friends and Family: Not on file  . Frequency of Social Gatherings with Friends and Family: Not on file  . Attends Religious Services: Not on file  . Active Member of Clubs or Organizations: Not on file  . Attends Archivist Meetings: Not on file  . Marital Status: Not on file   History reviewed. No pertinent family history. Scheduled Meds: . albuterol  2 puff Inhalation Q6H  . vitamin C  500 mg Oral Daily  . dexamethasone (DECADRON) injection  6 mg Intravenous Q24H  . enoxaparin (LOVENOX) injection  30 mg Subcutaneous Q24H  . mirtazapine  7.5 mg Oral QHS  . sertraline  50 mg Oral Daily  . zinc sulfate  220 mg Oral Daily   Continuous Infusions: . azithromycin Stopped (10/15/19 2144)  . cefTRIAXone (ROCEPHIN)  IV Stopped (10/15/19 2023)  . remdesivir 100 mg in NS 100 mL 100 mg (10/16/19 0844)   PRN Meds:.acetaminophen, iohexol, Resource ThickenUp Clear Allergies  Allergen Reactions  . Aspartame Tinitus    ARTIFICIAL SWEETNERS "CAUSE MY EARS TO  ROAR" ARTIFICIAL SWEETNERS "CAUSE MY EARS TO ROAR" ARTIFICIAL SWEETNERS "CAUSE MY EARS TO ROAR" ARTIFICIAL SWEETNERS "CAUSE MY EARS TO ROAR"   . Aspirin Tinitus  . Chocolate Tinitus  . Cocoa Tinitus  . Atorvastatin Other (See Comments)  . Niacin Other (See Comments)    Other reaction(s): Unknown  . Sulfa Antibiotics     Other reaction(s): Unknown  . Sulfasalazine Other (See Comments)  . Amoxicillin-Pot Clavulanate Diarrhea  . Doxycycline Diarrhea and Nausea Only     Vital Signs: BP (!) 128/58 (BP Location: Left Arm)   Pulse (!) 59   Temp 98.3 F (36.8 C) (Oral)   Resp 19   Ht 5\' 2"  (1.575 m)   Wt 65.8 kg   SpO2 92%   BMI 26.52 kg/m  Pain Scale: 0-10   Pain Score: 0-No pain   SpO2: SpO2: 92 % O2 Device:SpO2: 92 % O2 Flow Rate: .O2 Flow Rate (L/min): 1 L/min  IO: Intake/output summary:   Intake/Output Summary (Last 24 hours) at 10/16/2019 1311 Last data filed at 10/15/2019 2200 Gross per 24 hour  Intake 1718.12 ml  Output 160 ml  Net 1558.12 ml    LBM:   Baseline Weight: Weight: 65.8 kg Most recent weight: Weight: 65.8 kg     Palliative Assessment/Data: 30%     Time In: 1240 Time Out: 1320 Time Total: 50 minutes Visit consisted of counseling and education dealing with the complex and emotionally intense issues surrounding the need for palliative care and symptom management in the setting of serious and potentially life-threatening illness. Greater than 50%  of this time was spent counseling and coordinating care related to the above assessment and plan.  The above conversation was completed via telephone due to the restrictions during the COVID-19 pandemic. Thorough chart review and discussion with necessary members of the care team was completed as part of assessment. All issues were discussed and addressed but no physical exam was performed. Signed by: Florentina Jenny, PA-C Palliative Medicine Pager: [phone]  Please contact Palliative Medicine  Team phone at [phone] for questions and concerns.  For individual provider: See Shea Evans

## 2019-10-16 NOTE — Progress Notes (Signed)
Patient resting comfortably, currently on Room Air.  Oxygen saturation remains in the 90's.  Patient appears to be in no acute distress and denies any pain or shortness of breath Was able to tell me about her day and her husband. Patient did not eat much dinner, sat with her and assisted in feeding.  She was able to eat 25% of meal, a vanilla pudding and drink an entire Ensure.    Patient has had no acute cardiac events since admission.  Paged provider to request orders for cardiac monitoring be discontinued.  Order was entered by provider.  Patient level of care downgraded to Med Surge.    Normal Saline order expired.  Verified with provider; fluids have been discontinued.  Patient continues to receive antibiotics as outlined in plan of care.

## 2019-10-16 NOTE — Progress Notes (Signed)
Initial Nutrition Assessment  DOCUMENTATION CODES:   Not applicable  INTERVENTION:   Magic cup TID with meals, each supplement provides 290 kcal and 9 grams of protein  Ensure Enlive po BID, thickened at bedside to appropriate consistency, each supplement provides 350 kcal and 20 grams of protein  NUTRITION DIAGNOSIS:   Increased nutrient needs related to acute illness(COVID-19) as evidenced by estimated needs.  GOAL:   Patient will meet greater than or equal to 90% of their needs  MONITOR:   PO intake, Supplement acceptance, Labs  REASON FOR ASSESSMENT:   Malnutrition Screening Tool    ASSESSMENT:   86 yo female admitted with COVID PNA. She was diagnosed with COVID-19 at SNF [facility]. PMH includes recent CVA 07/2019, residual right sided weakness, dysphagia, HTN, dementia.   Patient had decreased oral intake at SNF PTA.  SLP following. Diet advanced to dysphagia 1 with honey thick liquids today. Suspect intake will continue to be poor. Palliative Care Team is following. Family is open to short term artificial nutrition, if needed, but they do not want to pursue permanent feeding tube.   Labs reviewed.  CBG's: 174-176  Medications reviewed and include vitamin C, decadron, remeron, zinc sulfate, remdesivir.   NUTRITION - FOCUSED PHYSICAL EXAM:  unable to complete due to COVID restrictions  Diet Order:   Diet Order            DIET - DYS 1 Room service appropriate? Yes; Fluid consistency: Honey Thick  Diet effective now              EDUCATION NEEDS:   Not appropriate for education at this time  Skin:  Skin Assessment: Reviewed RN Assessment  Last BM:  no BM documented  Height:   Ht Readings from Last 1 Encounters:  10/15/19 5\' 2"  (1.575 m)    Weight:   Wt Readings from Last 1 Encounters:  10/15/19 65.8 kg    Ideal Body Weight:  50 kg  BMI:  Body mass index is 26.52 kg/m.  Estimated Nutritional Needs:   Kcal:  1700-1900  Protein:  85-100  gm  Fluid:  >/= 1.6 L    Molli Barrows, RD, LDN, [place] Pager [phone] After Hours Pager [phone]

## 2019-10-16 NOTE — Plan of Care (Addendum)
Patient got up to chair today with PT well tolerated. No s/s of pain or distress. Patient diet changed to dysphagia 1 and honey thick liquids. Patient ate a small portion of lunch today, will continue to encourage to eat.Spoke to daughter [place] with daily update. All medication given well tolerated will continue to monitor for remainder of shift.   Problem: Education: Goal: Knowledge of risk factors and measures for prevention of condition will improve 10/16/2019 1511 by Orvan Falconer, RN Outcome: Progressing 10/16/2019 1511 by Orvan Falconer, RN Outcome: Progressing 10/16/2019 0734 by Orvan Falconer, RN Outcome: Progressing   Problem: Coping: Goal: Psychosocial and spiritual needs will be supported 10/16/2019 1511 by Orvan Falconer, RN Outcome: Progressing 10/16/2019 1511 by Orvan Falconer, RN Outcome: Progressing 10/16/2019 0734 by Orvan Falconer, RN Outcome: Progressing   Problem: Respiratory: Goal: Will maintain a patent airway 10/16/2019 1511 by Orvan Falconer, RN Outcome: Progressing 10/16/2019 1511 by Orvan Falconer, RN Outcome: Progressing 10/16/2019 0734 by Orvan Falconer, RN Outcome: Progressing Goal: Complications related to the disease process, condition or treatment will be avoided or minimized 10/16/2019 1511 by Orvan Falconer, RN Outcome: Progressing 10/16/2019 1511 by Orvan Falconer, RN Outcome: Progressing 10/16/2019 0734 by Orvan Falconer, RN Outcome: Progressing

## 2019-10-16 NOTE — Progress Notes (Addendum)
  Speech Language Pathology Treatment: Dysphagia  Patient Details Name: Tammy Wood MRN: [id number] DOB: 11-09-1926 Today's Date: 10/16/2019 Time: [id number] SLP Time Calculation (min) (ACUTE ONLY): 18 min  Assessment / Plan / Recommendation Clinical Impression  Tammy Wood was alert and responsive to simple commands this morning during trials with food/liquid. Applesauce and honey thick alternately consumed with moderate assist initially then mild for self feeding with cup (left hand). Immediate throat clear after initial bite applesauce which can be common with first bite after npo status- oral cavity cleaned prior. Over course of 12-15 minutes she had only several delayed throat clears and multiple swallows. SpO2 93% average without increased work of breathing. From subjective standpoint initiation of swallow did not appear significantly delayed. ST recommends Dys 1 (puree), honey thick liquids to be consumed with assist of RN when needed (encourage self feeding with left hand), and slow rate (understand this was what she consumed at [facility]). Can perform MBS if indications that she is not tolerating. Will continue to intervene while at [facility].   HPI HPI: Patient is a 86 year old female with history of recent CVA in 07/2019 (left basal ganglia infarct, received TPA, admitted at [facility]) residual right-sided weakness, dysphagia, essential hypertension, arthritis, then had a UTI in 08/2019.  She has been at [facility] facility since 09/10/2019.  History was obtained from the patient's daughter.  Patient's daughter reported that patient got exposed and contracted Covid at the facility.  She did not have any fevers, loss of smell or taste.  She did have coughing and decreased p.o. intake.  Patient had not been eating or drinking much hence she was started on IV fluids at the facility.  MBS on 08/28/19 at [facility] reported: "moderate oropharyngeal dysphagia. Patient  with cord level penetration of thin liquids, and stagnant penetration of nectar and honey thick liquids x1. No aspiration of trialed consistencies (thin liquid, nectar thick liquid, honey thick liquid, puree, ground) observed during today's evaluation." and SLP recommended Dysphagia 1 (puree) solids and honey-thick liquids.  Pt with hx of mild expressive and receptive aphasia.      SLP Plan  Continue with current plan of care       Recommendations  Diet recommendations: Dysphagia 1 (puree);Honey-thick liquid Liquids provided via: Cup;No straw Medication Administration: Crushed with puree Supervision: Staff to assist with self feeding;Full supervision/cueing for compensatory strategies Compensations: Slow rate;Small sips/bites Postural Changes and/or Swallow Maneuvers: Seated upright 90 degrees                Oral Care Recommendations: Oral care BID Follow up Recommendations: Skilled Nursing facility SLP Visit Diagnosis: Dysphagia, oropharyngeal phase (R13.12) Plan: Continue with current plan of care       GO                Tammy Wood 10/16/2019, 10:04 AM

## 2019-10-16 NOTE — Progress Notes (Signed)
OT Evaluation  Pt seen on RA with VSS. Maximove to chair. Painful RUE related to R hemiparesis and abnormal tone/anteriorly/inferiorly subluxed shoulder. FaceTimed daughter at end of session. Recommend return to SNF with eventual return home with daughter.     10/16/19 1400  OT Visit Information  Last OT Received On 10/16/19  Assistance Needed +2  PT/OT/SLP Co-Evaluation/Treatment Yes  Reason for Co-Treatment Complexity of the patient's impairments (multi-system involvement);To address functional/ADL transfers  OT goals addressed during session ADL's and self-care  History of Present Illness 86 year old female with history of recent CVA in 07/2019 (left basal ganglia infarct, received TPA, admitted at [facility]) residual right-sided weakness, dysphagia, essential hypertension, arthritis, then had a UTI in 08/2019.  She has been at [facility] facility since 09/10/2019.Patient was sent to ED for Covid pneumonia.  Precautions  Precautions Fall  Precaution Comments dense right hemi, painfail RUE and R LE/ankle  Required Braces or Orthoses Other Brace (order Prevalon boot for R foot)  Home Living  Family/patient expects to be discharged to: Skilled nursing facility  Prior Function  Level of Independence Needs assistance  Gait / Transfers Assistance Needed Total Lift to wc at facility; nonambulatory  ADL's / [place] for all ADL; PT did some of her grooming and self feeding; modified diet  Communication  Communication Expressive difficulties  Pain Assessment  Pain Assessment Faces  Faces Pain Scale 6  Pain Location RU/LE when repositioned  Pain Descriptors / Indicators Grimacing;Guarding;Discomfort  Pain Intervention(s) Limited activity within patient's tolerance;Repositioned  Cognition  Arousal/Alertness Awake/alert  Behavior During Therapy [facility] for tasks assessed/performed  Overall Cognitive Status History of cognitive  impairments - at baseline  Area of Impairment Orientation;Attention;Memory;Following commands;Safety/judgement;Awareness;Problem solving  Orientation Level Disoriented to;Place;Time;Situation  Current Attention Level Sustained  Memory Decreased short-term memory  Following Commands Follows one step commands inconsistently  Safety/Judgement Decreased awareness of safety;Decreased awareness of deficits  Awareness Emergent  Problem Solving Slow processing;Decreased initiation;Difficulty sequencing;Requires verbal cues;Requires tactile cues  General Comments Most likely close to baseline cognitively  Upper Extremity Assessment  Upper Extremity Assessment RUE deficits/detail;LUE deficits/detail  RUE Deficits / Details Edematous RUE; painful; anteriorly and inferiorly subluxed shoulder from R hemiparesis; nonfunctional UE; band removed and replaced due to bein tight on pt's arm  RUE Sensation decreased light touch  RUE Coordination decreased fine motor;decreased gross motor  LUE Deficits / Details generalized weakness  Lower Extremity Assessment  Lower Extremity Assessment Defer to PT evaluation  RLE Deficits / Details flaccid, painful to dorsiflex foot  Cervical / Trunk Assessment  Cervical / Trunk Assessment Other exceptions  Cervical / Trunk Exceptions L lateral lean  ADL  Overall ADL's  Needs assistance/impaired  Eating/Feeding Maximal assistance  Grooming Moderate assistance  Grooming Details (indicate cue type and reason) able to wash face  Upper Body Bathing Maximal assistance;Bed level  Lower Body Bathing Maximal assistance;Bed level  Upper Body Dressing  Maximal assistance;Sitting  Lower Body Dressing Total assistance  Toileting- Clothing Manipulation and Hygiene Total assistance  Toileting - Clothing Manipulation Details (indicate cue type and reason) incontinenet urine  Functional mobility during ADLs +2 for physical assistance;Maximal assistance  General ADL Comments Pt able  to assist with rolling in bed using stronger L side; painful to roll toward R affected UE  Bed Mobility  Overal bed mobility Needs Assistance  Bed Mobility Rolling  Rolling Mod assist;Max assist  General bed mobility comments mod to rioll to the right,  and total to le,  to support rithg extremities.  Transfers  Overall transfer level Needs assistance  General transfer comment Maximove used to lift pt to chair  Balance  Overall balance assessment Needs assistance  Sitting balance-Leahy Scale Poor  Sitting balance - Comments L lateral lean; poor awareness of midline  General Comments  General comments (skin integrity, edema, etc.) RUE posiitoned with 2 pillows underneath due to subluxation; RUE elevated;  OT - End of Session  Activity Tolerance Patient tolerated treatment well  Patient left in chair;with call bell/phone within reach;with chair alarm set  Nurse Communication Mobility status;Need for lift equipment Mayo Ao)  OT Assessment  OT Recommendation/Assessment Patient needs continued OT Services  OT Visit Diagnosis Unsteadiness on feet (R26.81);Other abnormalities of gait and mobility (R26.89);Muscle weakness (generalized) (M62.81);Other symptoms and signs involving cognitive function;Feeding difficulties (R63.3);Pain;Hemiplegia and hemiparesis  Hemiplegia - Right/Left Right  Hemiplegia - dominant/non-dominant Dominant  Hemiplegia - caused by Cerebral infarction (in oct 2020))  Pain - Right/Left Right  Pain - part of body Shoulder;Arm  OT Problem List Decreased strength;Decreased range of motion;Decreased activity tolerance;Impaired balance (sitting and/or standing);Decreased coordination;Decreased cognition;Decreased safety awareness;Decreased knowledge of use of DME or AE;Cardiopulmonary status limiting activity;Impaired sensation;Impaired tone;Impaired UE functional use;Pain;Increased edema  OT Plan  OT Frequency (ACUTE ONLY) Min 2X/week  OT Treatment/Interventions (ACUTE  ONLY) Self-care/ADL training;Therapeutic exercise;Neuromuscular education;Energy conservation;DME and/or AE instruction;Splinting;Therapeutic activities;Cognitive remediation/compensation;Patient/family education;Balance training  AM-PAC OT "6 Clicks" Daily Activity Outcome Measure (Version 2)  Help from another person eating meals? 2  Help from another person taking care of personal grooming? 2  Help from another person toileting, which includes using toliet, bedpan, or urinal? 1  Help from another person bathing (including washing, rinsing, drying)? 1  Help from another person to put on and taking off regular upper body clothing? 1  Help from another person to put on and taking off regular lower body clothing? 1  6 Click Score 8  OT Recommendation  Follow Up Recommendations SNF;Supervision/Assistance - 24 hour  OT Equipment None recommended by OT  Individuals Consulted  Consulted and Agree with Results and Recommendations Patient;Family member/caregiver  Family Member Consulted daughter  Acute Rehab OT Goals  Patient Stated Goal to improve to go to daughter's home  OT Goal Formulation With patient/family  Time For Goal Achievement 10/30/19  Potential to Achieve Goals Good  OT Time Calculation  OT Start Time (ACUTE ONLY) 1130  OT Stop Time (ACUTE ONLY) 1220  OT Time Calculation (min) 50 min  OT General Charges  $OT Visit 1 Visit  OT Evaluation  $OT Eval Moderate Complexity 1 Mod  OT Treatments  $Self Care/Home Management  8-22 mins  Written Expression  Dominant Hand Right  Maurie Boettcher, OT/L   Acute OT Clinical Specialist [place] Pager [phone] Office [phone]

## 2019-10-17 LAB — COMPREHENSIVE METABOLIC PANEL
ALT: 19 U/L (ref 0–44)
AST: 29 U/L (ref 15–41)
Albumin: 2.2 g/dL — ABNORMAL LOW (ref 3.5–5.0)
Alkaline Phosphatase: 58 U/L (ref 38–126)
Anion gap: 9 (ref 5–15)
BUN: 71 mg/dL — ABNORMAL HIGH (ref 8–23)
CO2: 23 mmol/L (ref 22–32)
Calcium: 8.8 mg/dL — ABNORMAL LOW (ref 8.9–10.3)
Chloride: 112 mmol/L — ABNORMAL HIGH (ref 98–111)
Creatinine, Ser: 1.95 mg/dL — ABNORMAL HIGH (ref 0.44–1.00)
GFR calc Af Amer: 26 mL/min — ABNORMAL LOW (ref >60)
GFR calc non Af Amer: 22 mL/min — ABNORMAL LOW (ref >60)
Glucose, Bld: 208 mg/dL — ABNORMAL HIGH (ref 70–99)
Potassium: 4.4 mmol/L (ref 3.5–5.1)
Sodium: 144 mmol/L (ref 135–145)
Total Bilirubin: 0.3 mg/dL (ref 0.3–1.2)
Total Protein: 5.3 g/dL — ABNORMAL LOW (ref 6.5–8.1)

## 2019-10-17 LAB — CBC WITH DIFFERENTIAL/PLATELET
Abs Immature Granulocytes: 0.04 10*3/uL (ref 0.00–0.07)
Basophils Absolute: 0 10*3/uL (ref 0.0–0.1)
Basophils Relative: 0 %
Eosinophils Absolute: 0 10*3/uL (ref 0.0–0.5)
Eosinophils Relative: 0 %
HCT: 33.5 % — ABNORMAL LOW (ref 36.0–46.0)
Hemoglobin: 10.1 g/dL — ABNORMAL LOW (ref 12.0–15.0)
Immature Granulocytes: 1 %
Lymphocytes Relative: 7 %
Lymphs Abs: 0.5 10*3/uL — ABNORMAL LOW (ref 0.7–4.0)
MCH: 28.2 pg (ref 26.0–34.0)
MCHC: 30.1 g/dL (ref 30.0–36.0)
MCV: 93.6 fL (ref 80.0–100.0)
Monocytes Absolute: 0.1 10*3/uL (ref 0.1–1.0)
Monocytes Relative: 2 %
Neutro Abs: 6.6 10*3/uL (ref 1.7–7.7)
Neutrophils Relative %: 90 %
Platelets: 302 10*3/uL (ref 150–400)
RBC: 3.58 MIL/uL — ABNORMAL LOW (ref 3.87–5.11)
RDW: 14.7 % (ref 11.5–15.5)
WBC: 7.3 10*3/uL (ref 4.0–10.5)
nRBC: 0 % (ref 0.0–0.2)

## 2019-10-17 LAB — GLUCOSE, CAPILLARY
Glucose-Capillary: 189 mg/dL — ABNORMAL HIGH (ref 70–99)
Glucose-Capillary: 171 mg/dL — ABNORMAL HIGH (ref 70–99)
Glucose-Capillary: 158 mg/dL — ABNORMAL HIGH (ref 70–99)
Glucose-Capillary: 151 mg/dL — ABNORMAL HIGH (ref 70–99)

## 2019-10-17 LAB — URINE CULTURE: Culture: >=100000 — AB

## 2019-10-17 LAB — HEMOGLOBIN A1C
Hgb A1c MFr Bld: 5.9 % — ABNORMAL HIGH (ref 4.8–5.6)
Mean Plasma Glucose: 122.63 mg/dL

## 2019-10-17 LAB — FERRITIN: Ferritin: 273 ng/mL (ref 11–307)

## 2019-10-17 LAB — D-DIMER, QUANTITATIVE: D-Dimer, Quant: 2.33 ug/mL-FEU — ABNORMAL HIGH (ref 0.00–0.50)

## 2019-10-17 LAB — C-REACTIVE PROTEIN: CRP: 1.5 mg/dL — ABNORMAL HIGH (ref <1.0)

## 2019-10-17 MED ORDER — INSULIN ASPART 100 UNIT/ML ~~LOC~~ SOLN: 0.0000 [IU] | Freq: Every day | SUBCUTANEOUS | Status: DC

## 2019-10-17 MED ORDER — INSULIN ASPART 100 UNIT/ML ~~LOC~~ SOLN
0.0000 [IU] | Freq: Three times a day (TID) | SUBCUTANEOUS | Status: DC
  Administered 2019-10-17 (×2): 3 [IU] via SUBCUTANEOUS
  Administered 2019-10-18: 5 [IU] via SUBCUTANEOUS
  Administered 2019-10-18: 0 [IU] via SUBCUTANEOUS
  Administered 2019-10-18 – 2019-10-19 (×3): 3 [IU] via SUBCUTANEOUS

## 2019-10-17 MED ORDER — HEPARIN SODIUM (PORCINE) 5000 UNIT/ML IJ SOLN
5000.0000 [IU] | Freq: Three times a day (TID) | INTRAMUSCULAR | Status: DC
  Administered 2019-10-17 – 2019-10-19 (×6): 5000 [IU] via SUBCUTANEOUS
  Filled 2019-10-17 (×6): qty 1

## 2019-10-17 MED ORDER — AMOXICILLIN 500 MG PO CAPS
500.0000 mg | ORAL_CAPSULE | Freq: Two times a day (BID) | ORAL | Status: DC
  Filled 2019-10-17 (×2): qty 1

## 2019-10-17 MED ORDER — GUAIFENESIN ER 600 MG PO TB12
1200.0000 mg | ORAL_TABLET | Freq: Two times a day (BID) | ORAL | Status: DC
  Administered 2019-10-17 – 2019-10-19 (×4): 1200 mg via ORAL
  Filled 2019-10-17 (×4): qty 2

## 2019-10-17 NOTE — Plan of Care (Addendum)
Patient in bed resting. No s/s of pain or distress. All medication given well tolerated. Nurse will continue to encourage patient to eat. Spoke to daughter Wanette with daily update. Will continue to monitor for remainder of shift.   Problem: Education: Goal: Knowledge of risk factors and measures for prevention of condition will improve 10/17/2019 1336 by Orvan Falconer, RN Outcome: Progressing 10/17/2019 1336 by Orvan Falconer, RN Outcome: Progressing   Problem: Coping: Goal: Psychosocial and spiritual needs will be supported 10/17/2019 1336 by Orvan Falconer, RN Outcome: Progressing 10/17/2019 1336 by Orvan Falconer, RN Outcome: Progressing   Problem: Respiratory: Goal: Will maintain a patent airway 10/17/2019 1336 by Orvan Falconer, RN Outcome: Progressing 10/17/2019 1336 by Orvan Falconer, RN Outcome: Progressing Goal: Complications related to the disease process, condition or treatment will be avoided or minimized 10/17/2019 1336 by Orvan Falconer, RN Outcome: Progressing 10/17/2019 1336 by Orvan Falconer, RN Outcome: Progressing

## 2019-10-17 NOTE — Progress Notes (Signed)
Patient rested comfortably all night.  Had a period of desaturation while sleeping.  Placed on 2L nasal canula for comfort, pt easily recovered back to 95-97%.  Was able to wear oxygen down to 1L this morning.  Will continue to monitor respiratory status and wean oxygen as appropriate.

## 2019-10-17 NOTE — Progress Notes (Addendum)
Progress Note        PROGRESS NOTE    Tammy Wood  [id number] DOB: [birth year] DOA: 10/14/2019 PCP: Seward Carol, MD    Brief Narrative:  86 year old female with history of recent CVA in 07/2019 (left basal ganglia infarct, received TPA, admitted at [facility]) residual right-sided weakness, dysphagia, essential hypertension, arthritis, then had a UTI in 08/2019.  She has been at [facility] facility since 09/10/2019.  History was obtained from the patient's daughter.  Patient's daughter reported that patient got exposed and contracted Covid at the facility.  She did not have any fevers, loss of smell or taste.  She did have coughing and decreased p.o. intake.  Patient had not been eating or drinking much hence she was started on IV fluids at the facility yesterday.   Patient was sent to ED for Covid pneumonia. At the time of my encounter, somnolent, difficult to arouse.  Vital signs on the monitor stable. ABG showed pH of 7.3, PCO2 44.5, PO2 150 Please see H&P for full details  Subjective:  Patient is poor historian, she denies any complaints, no significant events as discussed with staff  Assessment & Plan:   Principal Problem:   Multifocal pneumonia Active Problems:   Acute encephalopathy   Dementia ([place])   Pneumonia due to COVID-19 virus   Palliative care encounter   Acute hypoxic respiratory failure secondary to multifocal pneumonia due to COVID-19 virus infection -on presentation she did require 5 L, this appears to be improving, currently she is between room air and 2 L. -Continue with IV remdesivir day 4/5. -Continue with IV Decadron -Patient was encouraged with incentive spirometry and flutter valve, staff will keep encouraging her as well. -Continue supportive therapy with vitamin C, D and zinc -Was treated with Rocephin and azithromycin  Acute encephalopathy - due to  hypoxia, COVID-19 infection, this appears to be  improving  History of CVA with right hemiparesis.   - Recent CVA in November 2020-left basal ganglia infarct and status post TPA. - Continue with secondary stroke prevention with aspirin/Plavix and statin  Abnormal UA -Patient urine culture growing Klebsiella and Enterococcus, did receive Rocephin, Enterococcus most likely contamination, especially with normal procalcitonin, and no fever or white count.  Dysphagia, likely due to previous CVA - seen by SLP -We will start on Remeron to improve her appetite .  Acute kidney injury superimposed on chronic kidney disease stage IV. - Baseline creatinine about 1.5-2.0, Serum creatinine admission was 1.97 and up trended to 2.16.  Possibly prerenal secondary to dehydration versus acute renal failure syndrome of COVID-19 virus infection. Monitor renal function Avoid nephrotoxic agents    DVT prophylaxis: Lovenox subcute Code Status: DNR  Family Communication: D/W daughter Butch Penny 1/18 Disposition Plan: SNF vs Home with HH(pending clinical course, family most likely interested in taking patient back home)   Consultants:   palliative  Procedures: None  Antimicrobials: Ceftriaxone Azithromycin    Objective: Vitals:   10/16/19 2356 10/17/19 0500 10/17/19 0600 10/17/19 0800  BP:  (!) 109/46  (!) 138/58  Pulse:  (!) 58    Resp:  15  16  Temp:  98.2 F (36.8 C)  98.2 F (36.8 C)  TempSrc:  Oral  Oral  SpO2: 92% 96% 95%   Weight:      Height:        Intake/Output Summary (Last 24 hours) at 10/17/2019 1604 Last data filed at 10/16/2019 2017 Gross per 24 hour  Intake 250 ml  Output --  Net 250 ml   Filed Weights   10/15/19 0421  Weight: 65.8 kg    Examination:  Awake Alert,  frail , present, answering questions appropriately, but easily distracted Metrical chest wall movement, good air entry bilaterally, clear to auscultation Regular rate and rhythm, no rubs and gallops Abdomen soft nontender, nondistended No Cyanosis,  Clubbing or edema, dense right side weakness, with trace edema.     Data Reviewed: I have personally reviewed following labs and imaging studies  CBC: Recent Labs  Lab 10/14/19 1237 10/14/19 1959 10/15/19 0803 10/16/19 0423 10/17/19 0235  WBC 6.5 4.3 5.3 7.1 7.3  NEUTROABS 5.7  --  4.4 6.0 6.6  HGB 10.9* 10.5* 10.2* 9.3* 10.1*  HCT 37.2 35.8* 34.7* 30.2* 33.5*  MCV 95.4 95.7 94.8 92.6 93.6  PLT 218 198 227 258 [id number]   Basic Metabolic Panel: Recent Labs  Lab 10/14/19 1237 10/14/19 1959 10/16/19 0423 10/17/19 0235  NA 138  --  138 144  K 4.5  --  4.4 4.4  CL 103  --  106 112*  CO2 25  --  23 23  GLUCOSE 151*  --  203* 208*  BUN 44*  --  62* 71*  CREATININE 1.97* 2.16* 2.06* 1.95*  CALCIUM 8.8*  --  8.5* 8.8*   GFR: Estimated Creatinine Clearance: 17.1 mL/min (A) (by C-G formula based on SCr of 1.95 mg/dL (H)). Liver Function Tests: Recent Labs  Lab 10/14/19 1237 10/16/19 0423 10/17/19 0235  AST 31 26 29   ALT 16 15 19   ALKPHOS 72 56 58  BILITOT 0.5 0.6 0.3  PROT 6.6 5.2* 5.3*  ALBUMIN 2.6* 2.1* 2.2*   No results for input(s): LIPASE, AMYLASE in the last 168 hours. No results for input(s): AMMONIA in the last 168 hours. Coagulation Profile: No results for input(s): INR, PROTIME in the last 168 hours. Cardiac Enzymes: No results for input(s): CKTOTAL, CKMB, CKMBINDEX, TROPONINI in the last 168 hours. BNP (last 3 results) No results for input(s): PROBNP in the last 8760 hours. HbA1C: Recent Labs    10/17/19 0235  HGBA1C 5.9*   CBG: Recent Labs  Lab 10/16/19 1227 10/16/19 1630 10/16/19 2140 10/17/19 0825 10/17/19 1251  GLUCAP 176* 172* 204* 189* 171*   Lipid Profile: No results for input(s): CHOL, HDL, LDLCALC, TRIG, CHOLHDL, LDLDIRECT in the last 72 hours. Thyroid Function Tests: No results for input(s): TSH, T4TOTAL, FREET4, T3FREE, THYROIDAB in the last 72 hours. Anemia Panel: Recent Labs    10/16/19 0423 10/17/19 0235  FERRITIN 109 273    Sepsis Labs: Recent Labs  Lab 10/14/19 1237  PROCALCITON <0.10  LATICACIDVEN 1.5    Recent Results (from the past 240 hour(s))  Blood Culture (routine x 2)     Status: None (Preliminary result)   Collection Time: 10/14/19 12:32 PM   Specimen: BLOOD LEFT HAND  Result Value Ref Range Status   Specimen Description   Final    BLOOD LEFT HAND Performed at [facility] [address], [place], [place] [address]    Special Requests   Final    BOTTLES DRAWN AEROBIC AND ANAEROBIC Blood Culture adequate volume Performed at [facility] [address], [place], [place] [address]    Culture   Final    NO GROWTH 3 DAYS Performed at [facility], [place] [address], [place], [place] [address]    Report Status PENDING  Incomplete  Blood Culture (routine x 2)  Status: None (Preliminary result)   Collection Time: 10/14/19 12:37 PM   Specimen: BLOOD  Result Value Ref Range Status   Specimen Description   Final    BLOOD LEFT ANTECUBITAL Performed at [facility] [address], [place], [place] [address]    Special Requests   Final    BOTTLES DRAWN AEROBIC AND ANAEROBIC Blood Culture adequate volume Performed at [facility] [address], [place], [place] [address]    Culture   Final    NO GROWTH 3 DAYS Performed at [facility], [place] [address] [place], [place] [address]    Report Status PENDING  Incomplete  Urine culture     Status: Abnormal   Collection Time: 10/14/19  4:41 PM   Specimen: Urine, Random  Result Value Ref Range Status   Specimen Description   Final    URINE, RANDOM Performed at [facility] [address] [place], [place] [address]    Special Requests   Final    NONE Performed at [facility], [place] [address] [place], [place] [address]    Culture (A)  Final    >=100,000 COLONIES/mL ENTEROCOCCUS FAECALIS 50,000 COLONIES/mL  KLEBSIELLA PNEUMONIAE    Report Status 10/17/2019 FINAL  Final   Organism ID, Bacteria ENTEROCOCCUS FAECALIS (A)  Final   Organism ID, Bacteria KLEBSIELLA PNEUMONIAE (A)  Final      Susceptibility   Enterococcus faecalis - MIC*    AMPICILLIN <=2 SENSITIVE Sensitive     NITROFURANTOIN <=16 SENSITIVE Sensitive     VANCOMYCIN 1 SENSITIVE Sensitive     * >=100,000 COLONIES/mL ENTEROCOCCUS FAECALIS   Klebsiella pneumoniae - MIC*    AMPICILLIN >=32 RESISTANT Resistant     CEFAZOLIN <=4 SENSITIVE Sensitive     CEFTRIAXONE <=0.25 SENSITIVE Sensitive     CIPROFLOXACIN <=0.25 SENSITIVE Sensitive     GENTAMICIN <=1 SENSITIVE Sensitive     IMIPENEM <=0.25 SENSITIVE Sensitive     NITROFURANTOIN 64 INTERMEDIATE Intermediate     TRIMETH/SULFA <=20 SENSITIVE Sensitive     AMPICILLIN/SULBACTAM 8 SENSITIVE Sensitive     PIP/TAZO <=4 SENSITIVE Sensitive     * 50,000 COLONIES/mL KLEBSIELLA PNEUMONIAE  MRSA PCR Screening     Status: None   Collection Time: 10/15/19  1:10 PM   Specimen: Nasal Mucosa; Nasopharyngeal  Result Value Ref Range Status   MRSA by PCR NEGATIVE NEGATIVE Final    Comment:        The GeneXpert MRSA Assay (FDA approved for NASAL specimens only), is one component of a comprehensive MRSA colonization surveillance program. It is not intended to diagnose MRSA infection nor to guide or monitor treatment for MRSA infections. Performed at [facility], [place] [address] [place], [place] [address]          Radiology Studies: No results found.      Scheduled Meds: . albuterol  2 puff Inhalation Q6H  . vitamin C  500 mg Oral Daily  . dexamethasone (DECADRON) injection  6 mg Intravenous Q24H  . feeding supplement (ENSURE ENLIVE)  237 mL Oral BID BM  . heparin injection (subcutaneous)  5,000 Units Subcutaneous Q8H  . insulin aspart  0-15 Units Subcutaneous TID WC  . insulin aspart  0-5 Units Subcutaneous QHS  . mirtazapine  7.5 mg Oral QHS  .  sertraline  50 mg Oral Daily  . zinc sulfate  220 mg Oral Daily   Continuous Infusions: . remdesivir 100 mg  in NS 100 mL 100 mg (10/17/19 1045)     LOS: 3 days      Phillips Climes, MD [facility]  If 7PM-7AM, please contact night-coverage [email or web address] Password [facility] 10/17/2019, 4:04 PM

## 2019-10-17 NOTE — Progress Notes (Addendum)
  Speech Language Pathology Treatment: Dysphagia  Patient Details Name: Tammy Wood MRN: [id number] DOB: 08-29-1927 Today's Date: 10/17/2019 Time: [id number] SLP Time Calculation (min) (ACUTE ONLY): 24 min  Assessment / Plan / Recommendation Clinical Impression  Pt has deep congested unproductive cough at baseline increasing in frequency following honey thick liquid and 2 bites puree texture. She appears very fatigued with poor endurance and coughing episodes further her lethargy. SpO2 saturations stable at 97% during session. Intake has been minimal. Would continue puree (D1) and honey thick liquids for now (current diet prior to this admission). From a swallowing standpoint do not forsee her swallow function improving. If she declines further family and physician can discuss possible comfort feeds and allow pt foods of her choice with aspiration risks. ST will sign off at this time. Please reconsult if needed.    HPI HPI: Patient is a 86 year old female with history of recent CVA in 07/2019 (left basal ganglia infarct, received TPA, admitted at [facility]) residual right-sided weakness, dysphagia, essential hypertension, arthritis, then had a UTI in 08/2019.  She has been at [facility] facility since 09/10/2019.  History was obtained from the patient's daughter.  Patient's daughter reported that patient got exposed and contracted Covid at the facility.  She did not have any fevers, loss of smell or taste.  She did have coughing and decreased p.o. intake.  Patient had not been eating or drinking much hence she was started on IV fluids at the facility.  MBS on 08/28/19 at [facility] reported: "moderate oropharyngeal dysphagia. Patient with cord level penetration of thin liquids, and stagnant penetration of nectar and honey thick liquids x1. No aspiration of trialed consistencies (thin liquid, nectar thick liquid, honey thick liquid, puree, ground) observed during  today's evaluation." and SLP recommended Dysphagia 1 (puree) solids and honey-thick liquids.  Pt with hx of mild expressive and receptive aphasia.      SLP Plan  Discharge SLP treatment due to (comment);Other (Comment)(on baseline po's)       Recommendations  Diet recommendations: Dysphagia 1 (puree);Honey-thick liquid Liquids provided via: Cup;No straw Medication Administration: Crushed with puree Supervision: Staff to assist with self feeding;Full supervision/cueing for compensatory strategies Compensations: Slow rate;Small sips/bites Postural Changes and/or Swallow Maneuvers: Seated upright 90 degrees                Oral Care Recommendations: Oral care BID Follow up Recommendations: Skilled Nursing facility SLP Visit Diagnosis: Dysphagia, oropharyngeal phase (R13.12) Plan: Discharge SLP treatment due to (comment);Other (Comment)(on baseline po's)                      Houston Siren 10/17/2019, 4:19 PM  [phone]

## 2019-10-18 DIAGNOSIS — F0150 Vascular dementia without behavioral disturbance: Secondary | ICD-10-CM

## 2019-10-18 LAB — CBC WITH DIFFERENTIAL/PLATELET
Abs Immature Granulocytes: 0.05 10*3/uL (ref 0.00–0.07)
Basophils Absolute: 0 10*3/uL (ref 0.0–0.1)
Basophils Relative: 0 %
Eosinophils Absolute: 0 10*3/uL (ref 0.0–0.5)
Eosinophils Relative: 0 %
HCT: 32 % — ABNORMAL LOW (ref 36.0–46.0)
Hemoglobin: 9.4 g/dL — ABNORMAL LOW (ref 12.0–15.0)
Immature Granulocytes: 1 %
Lymphocytes Relative: 6 %
Lymphs Abs: 0.4 10*3/uL — ABNORMAL LOW (ref 0.7–4.0)
MCH: 27.8 pg (ref 26.0–34.0)
MCHC: 29.4 g/dL — ABNORMAL LOW (ref 30.0–36.0)
MCV: 94.7 fL (ref 80.0–100.0)
Monocytes Absolute: 0.1 10*3/uL (ref 0.1–1.0)
Monocytes Relative: 1 %
Neutro Abs: 7.1 10*3/uL (ref 1.7–7.7)
Neutrophils Relative %: 92 %
Platelets: 313 10*3/uL (ref 150–400)
RBC: 3.38 MIL/uL — ABNORMAL LOW (ref 3.87–5.11)
RDW: 14.8 % (ref 11.5–15.5)
WBC: 7.7 10*3/uL (ref 4.0–10.5)
nRBC: 0 % (ref 0.0–0.2)

## 2019-10-18 LAB — COMPREHENSIVE METABOLIC PANEL
ALT: 18 U/L (ref 0–44)
AST: 24 U/L (ref 15–41)
Albumin: 2.4 g/dL — ABNORMAL LOW (ref 3.5–5.0)
Alkaline Phosphatase: 53 U/L (ref 38–126)
Anion gap: 12 (ref 5–15)
BUN: 80 mg/dL — ABNORMAL HIGH (ref 8–23)
CO2: 23 mmol/L (ref 22–32)
Calcium: 9.1 mg/dL (ref 8.9–10.3)
Chloride: 109 mmol/L (ref 98–111)
Creatinine, Ser: 1.94 mg/dL — ABNORMAL HIGH (ref 0.44–1.00)
GFR calc Af Amer: 26 mL/min — ABNORMAL LOW (ref >60)
GFR calc non Af Amer: 22 mL/min — ABNORMAL LOW (ref >60)
Glucose, Bld: 216 mg/dL — ABNORMAL HIGH (ref 70–99)
Potassium: 4.6 mmol/L (ref 3.5–5.1)
Sodium: 144 mmol/L (ref 135–145)
Total Bilirubin: 0.9 mg/dL (ref 0.3–1.2)
Total Protein: 5.5 g/dL — ABNORMAL LOW (ref 6.5–8.1)

## 2019-10-18 LAB — D-DIMER, QUANTITATIVE: D-Dimer, Quant: 2.64 ug/mL-FEU — ABNORMAL HIGH (ref 0.00–0.50)

## 2019-10-18 LAB — GLUCOSE, CAPILLARY
Glucose-Capillary: 208 mg/dL — ABNORMAL HIGH (ref 70–99)
Glucose-Capillary: 151 mg/dL — ABNORMAL HIGH (ref 70–99)
Glucose-Capillary: 113 mg/dL — ABNORMAL HIGH (ref 70–99)

## 2019-10-18 LAB — FERRITIN: Ferritin: 120 ng/mL (ref 11–307)

## 2019-10-18 LAB — C-REACTIVE PROTEIN: CRP: 1.1 mg/dL — ABNORMAL HIGH (ref <1.0)

## 2019-10-18 MED ORDER — FLUCONAZOLE 100 MG PO TABS
100.0000 mg | ORAL_TABLET | Freq: Every day | ORAL | Status: DC
  Administered 2019-10-19: 100 mg via ORAL
  Filled 2019-10-18: qty 1

## 2019-10-18 MED ORDER — FLUCONAZOLE 200 MG PO TABS
200.0000 mg | ORAL_TABLET | Freq: Once | ORAL | Status: AC
  Administered 2019-10-18: 200 mg via ORAL
  Filled 2019-10-18: qty 1

## 2019-10-18 MED ORDER — CLOPIDOGREL BISULFATE 75 MG PO TABS
75.0000 mg | ORAL_TABLET | Freq: Every day | ORAL | Status: DC
  Administered 2019-10-19: 75 mg via ORAL
  Filled 2019-10-18: qty 1

## 2019-10-18 MED ORDER — BIOTENE DRY MOUTH MT LIQD
15.0000 mL | Freq: Four times a day (QID) | OROMUCOSAL | Status: DC
  Administered 2019-10-18 – 2019-10-19 (×4): 15 mL via TOPICAL

## 2019-10-18 NOTE — Progress Notes (Signed)
Palliative Care Progress Note  Follow-up phone call today to patient's daughter Durenda Guthrie. I updated her on her mom's condition and per the request of the primary team confirmed that there were no concerns about the patient's code status or goals of care. I provided reassurance that code status would not impact her treatment options and that we would provide every best chance for Tammy Wood to improved to the degree she is able given her baseline condition following a stroke a few months ago.  The main gaol of this patient and family is to be able to return home as soon as possible. They have paid 24 hour in home caregivers lined up and the equipment necessary to care for her. Current plan is to complete treatment for Covid-19 infection and return to her home in [place], [place]. I did introduce the concept of hospice care and how depending on her condition and how things stabilize out for her this may be a very appropriate referral to help with post-acute care plan.  Fortunately Mrs. Zwiers has not has agitation or difficult to manage delirium- continue to provide all nursing interventions to minimize this risk.  Palliative care will continue to follow and support this patient.  Lane Hacker, DO Palliative Medicine [phone]   Time: 35 min Greater than 50%  of this time was spent counseling and coordinating care related to the above assessment and plan.

## 2019-10-18 NOTE — Progress Notes (Addendum)
Chaplain responding to spiritual care consult entered by Dr Hilma Favors.    Provided pastoral presence, prayers, ritual of singing hymn at bedside.   Tammy Wood was welcoming of chaplain presence.  Tearful and mildly confused at chaplain arrival - initially asking to tell her father that she wants to go home.  Later, through conversation, she requested that chaplain speak with son, Tammy Wood, to ensure Tammy Wood knows where she is.  She was able to speak with chaplain about her children, grandchildren, and great grandchildren.  She welcomed prayers and hymn singing at bedside.  Provided comforting presence and reassurance.     Tammy Wood, MDiv, [facility] Lead Clinical Chaplain  [place]

## 2019-10-18 NOTE — Progress Notes (Signed)
PROGRESS NOTE  Tammy Wood [id number] DOB: 1927-09-24 DOA: 10/14/2019 PCP: Seward Carol, MD   LOS: 4 days   Brief Narrative / Interim history: 86 year old female with recent CVA November 2020 (left basal ganglia infarct, received TPA, admitted at [facility]), residual right-sided weakness, dysphagia, HTN, recent UTI in 09/17/2019 and has been at [facility] nonambulatory since her stroke.  She got exposed and contracted Covid at the facility per patient's daughter.  She was sent to the ED and admitted to the hospital for Covid pneumonia.  Subjective / 24h Interval events: She appears comfortable, asking for water persistently does not really answer any of my other questions  Assessment & Plan:  Principal Problem Acute Hypoxic Respiratory Failure due to Covid-19 Viral Illness -On presentation she required 5 L nasal cannula, this appears to be improving currently on room air but requiring 2 L occasionally -Finished remdesivir, today is day 5/5 -Continue IV steroids -Continue supportive therapy -Also completed a course of ceftriaxone and azithromycin   COVID-19 Labs  Recent Labs    10/16/19 0423 10/17/19 0235 10/18/19 0440  DDIMER 1.89* 2.33* 2.64*  FERRITIN 109 273 120  CRP 1.4* 1.5* 1.1*   Active Problems Acute metabolic encephalopathy -Due to COVID-19 infection, this appears to be improving  Recent CVA with right-sided hemiparesis -Continue Plavix  Dysphagia, likely due to CVA -Speech following,  Acute kidney injury on chronic kidney disease stage IV -Baseline creatinine 1.5-2.0, was as high as 2.16, possibly prerenal -Her creatinine has currently improved and has remained stable, 1.94 this morning   Scheduled Meds: . albuterol  2 puff Inhalation Q6H  . antiseptic oral rinse  15 mL Topical QID  . vitamin C  500 mg Oral Daily  . [START ON 10/19/2019] clopidogrel  75 mg Oral Daily  . dexamethasone (DECADRON) injection  6 mg Intravenous Q24H  . feeding  supplement (ENSURE ENLIVE)  237 mL Oral BID BM  . fluconazole  200 mg Oral Once   Followed by  . [START ON 10/19/2019] fluconazole  100 mg Oral Daily  . guaiFENesin  1,200 mg Oral BID  . heparin injection (subcutaneous)  5,000 Units Subcutaneous Q8H  . insulin aspart  0-15 Units Subcutaneous TID WC  . insulin aspart  0-5 Units Subcutaneous QHS  . mirtazapine  7.5 mg Oral QHS  . sertraline  50 mg Oral Daily  . zinc sulfate  220 mg Oral Daily   Continuous Infusions: PRN Meds:.acetaminophen, iohexol, Resource ThickenUp Clear  DVT prophylaxis: Heparin Code Status: DNR Family Communication: Discussed with daughter over the phone Patient admitted from: SNF Anticipated d/c place: Home Barriers to d/c: Finishing remdesivir, if stable over the next 24 hours may be able to go home tomorrow with 24-hour care  Consultants:  None  Procedures:  None   Microbiology: None   Antimicrobials: None    Objective: Vitals:   10/17/19 1622 10/17/19 2023 10/18/19 0500 10/18/19 0730  BP: 106/62 (!) 115/48  123/64  Pulse: (!) 58 69  69  Resp: 17 16  16   Temp: 98.6 F (37 C) 98.5 F (36.9 C) 98.7 F (37.1 C) 97.8 F (36.6 C)  TempSrc: Oral Oral Oral Oral  SpO2: 97% 97%  93%  Weight:      Height:        Intake/Output Summary (Last 24 hours) at 10/18/2019 1521 Last data filed at 10/18/2019 1500 Gross per 24 hour  Intake 275 ml  Output --  Net 275 ml   [employer]   10/15/19  0421  Weight: 65.8 kg    Examination:  Constitutional: NAD Eyes: no scleral icterus ENMT: Mucous membranes are moist.  Neck: normal, supple Respiratory: clear to auscultation bilaterally, no wheezing, no crackles. Normal respiratory effort. Cardiovascular: Regular rate and rhythm, no murmurs / rubs / gallops. Abdomen: non distended, no tenderness. Bowel sounds positive.  Musculoskeletal: no clubbing / cyanosis.  Skin: no rashes Neurologic: Right-sided weakness  Data Reviewed: I have independently  reviewed following labs and imaging studies   CBC: Recent Labs  Lab 10/14/19 1237 10/14/19 1237 10/14/19 1959 10/15/19 0803 10/16/19 0423 10/17/19 0235 10/18/19 0440  WBC 6.5   < > 4.3 5.3 7.1 7.3 7.7  NEUTROABS 5.7  --   --  4.4 6.0 6.6 7.1  HGB 10.9*   < > 10.5* 10.2* 9.3* 10.1* 9.4*  HCT 37.2   < > 35.8* 34.7* 30.2* 33.5* 32.0*  MCV 95.4   < > 95.7 94.8 92.6 93.6 94.7  PLT 218   < > 198 227 258 302 313   < > = values in this interval not displayed.   Basic Metabolic Panel: Recent Labs  Lab 10/14/19 1237 10/14/19 1959 10/16/19 0423 10/17/19 0235 10/18/19 0440  NA 138  --  138 144 144  K 4.5  --  4.4 4.4 4.6  CL 103  --  106 112* 109  CO2 25  --  23 23 23   GLUCOSE 151*  --  203* 208* 216*  BUN 44*  --  62* 71* 80*  CREATININE 1.97* 2.16* 2.06* 1.95* 1.94*  CALCIUM 8.8*  --  8.5* 8.8* 9.1   GFR: Estimated Creatinine Clearance: 17.2 mL/min (A) (by C-G formula based on SCr of 1.94 mg/dL (H)). Liver Function Tests: Recent Labs  Lab 10/14/19 1237 10/16/19 0423 10/17/19 0235 10/18/19 0440  AST 31 26 29 24   ALT 16 15 19 18   ALKPHOS 72 56 58 53  BILITOT 0.5 0.6 0.3 0.9  PROT 6.6 5.2* 5.3* 5.5*  ALBUMIN 2.6* 2.1* 2.2* 2.4*   No results for input(s): LIPASE, AMYLASE in the last 168 hours. No results for input(s): AMMONIA in the last 168 hours. Coagulation Profile: No results for input(s): INR, PROTIME in the last 168 hours. Cardiac Enzymes: No results for input(s): CKTOTAL, CKMB, CKMBINDEX, TROPONINI in the last 168 hours. BNP (last 3 results) No results for input(s): PROBNP in the last 8760 hours. HbA1C: Recent Labs    10/17/19 0235  HGBA1C 5.9*   CBG: Recent Labs  Lab 10/17/19 1251 10/17/19 1645 10/17/19 2323 10/18/19 0741 10/18/19 1245  GLUCAP 171* 158* 151* 208* 151*   Lipid Profile: No results for input(s): CHOL, HDL, LDLCALC, TRIG, CHOLHDL, LDLDIRECT in the last 72 hours. Thyroid Function Tests: No results for input(s): TSH, T4TOTAL,  FREET4, T3FREE, THYROIDAB in the last 72 hours. Anemia Panel: Recent Labs    10/17/19 0235 10/18/19 0440  FERRITIN 273 120   Urine analysis:    Component Value Date/Time   COLORURINE YELLOW 10/14/2019 1641   APPEARANCEUR TURBID (A) 10/14/2019 1641   LABSPEC 1.017 10/14/2019 1641   PHURINE 5.0 10/14/2019 1641   GLUCOSEU NEGATIVE 10/14/2019 1641   HGBUR NEGATIVE 10/14/2019 1641   BILIRUBINUR NEGATIVE 10/14/2019 1641   KETONESUR 5 (A) 10/14/2019 1641   PROTEINUR 100 (A) 10/14/2019 1641   NITRITE NEGATIVE 10/14/2019 1641   LEUKOCYTESUR MODERATE (A) 10/14/2019 1641   Sepsis Labs: Invalid input(s): PROCALCITONIN, LACTICIDVEN  Recent Results (from the past 240 hour(s))  Blood Culture (routine x 2)  Status: None (Preliminary result)   Collection Time: 10/14/19 12:32 PM   Specimen: BLOOD LEFT HAND  Result Value Ref Range Status   Specimen Description   Final    BLOOD LEFT HAND Performed at [facility] [address] [place], [place] [address]    Special Requests   Final    BOTTLES DRAWN AEROBIC AND ANAEROBIC Blood Culture adequate volume Performed at [facility] [address], [place], [place] [address]    Culture   Final    NO GROWTH 4 DAYS Performed at [facility], [place] [address] [place], [place] [address]    Report Status PENDING  Incomplete  Blood Culture (routine x 2)     Status: None (Preliminary result)   Collection Time: 10/14/19 12:37 PM   Specimen: BLOOD  Result Value Ref Range Status   Specimen Description   Final    BLOOD LEFT ANTECUBITAL Performed at [facility] [address] [place], [place] [address]    Special Requests   Final    BOTTLES DRAWN AEROBIC AND ANAEROBIC Blood Culture adequate volume Performed at [facility] [address] [place], [place] [address]    Culture   Final    NO GROWTH 4 DAYS Performed at [facility], [place] [address],  [place], [place] [address]    Report Status PENDING  Incomplete  Urine culture     Status: Abnormal   Collection Time: 10/14/19  4:41 PM   Specimen: Urine, Random  Result Value Ref Range Status   Specimen Description   Final    URINE, RANDOM Performed at [facility] [address] [place], [place] [address]    Special Requests   Final    NONE Performed at [facility], [place] [address], [place], [place] [address]    Culture (A)  Final    >=100,000 COLONIES/mL ENTEROCOCCUS FAECALIS 50,000 COLONIES/mL KLEBSIELLA PNEUMONIAE    Report Status 10/17/2019 FINAL  Final   Organism ID, Bacteria ENTEROCOCCUS FAECALIS (A)  Final   Organism ID, Bacteria KLEBSIELLA PNEUMONIAE (A)  Final      Susceptibility   Enterococcus faecalis - MIC*    AMPICILLIN <=2 SENSITIVE Sensitive     NITROFURANTOIN <=16 SENSITIVE Sensitive     VANCOMYCIN 1 SENSITIVE Sensitive     * >=100,000 COLONIES/mL ENTEROCOCCUS FAECALIS   Klebsiella pneumoniae - MIC*    AMPICILLIN >=32 RESISTANT Resistant     CEFAZOLIN <=4 SENSITIVE Sensitive     CEFTRIAXONE <=0.25 SENSITIVE Sensitive     CIPROFLOXACIN <=0.25 SENSITIVE Sensitive     GENTAMICIN <=1 SENSITIVE Sensitive     IMIPENEM <=0.25 SENSITIVE Sensitive     NITROFURANTOIN 64 INTERMEDIATE Intermediate     TRIMETH/SULFA <=20 SENSITIVE Sensitive     AMPICILLIN/SULBACTAM 8 SENSITIVE Sensitive     PIP/TAZO <=4 SENSITIVE Sensitive     * 50,000 COLONIES/mL KLEBSIELLA PNEUMONIAE  MRSA PCR Screening     Status: None   Collection Time: 10/15/19  1:10 PM   Specimen: Nasal Mucosa; Nasopharyngeal  Result Value Ref Range Status   MRSA by PCR NEGATIVE NEGATIVE Final    Comment:        The GeneXpert MRSA Assay (FDA approved for NASAL specimens only), is one component of a comprehensive MRSA colonization surveillance program. It is not intended to diagnose MRSA infection nor to guide or monitor treatment for MRSA infections. Performed at  [facility], [address] [place], [place] [address]       Radiology Studies: No results found.   Marzetta Board, MD, PhD [facility]  Contact via  [email or web address]  [place] P: [phone] F: [phone]

## 2019-10-18 NOTE — Progress Notes (Signed)
Pharmacy Antibiotic Note  Tammy Wood is a 86 y.o. female admitted on 10/14/2019 with Oropharyngeal candidiasis.  Pharmacy has been consulted for fluconazole dosing.  Plan: -Start fluconazole 200 mg x 1 dose followed by fluconazole 100 mg daily  -Consider a 7-14 day course based on symptom resolution  -Pharmacy to sign off   Height: 5\' 2"  (157.5 cm) Weight: 145 lb (65.8 kg) IBW/kg (Calculated) : 50.1  Temp (24hrs), Avg:98.4 F (36.9 C), Min:97.8 F (36.6 C), Max:98.7 F (37.1 C)  Recent Labs  Lab 10/14/19 1237 10/14/19 1237 10/14/19 1959 10/15/19 0803 10/16/19 0423 10/17/19 0235 10/18/19 0440  WBC 6.5   < > 4.3 5.3 7.1 7.3 7.7  CREATININE 1.97*  --  2.16*  --  2.06* 1.95* 1.94*  LATICACIDVEN 1.5  --   --   --   --   --   --    < > = values in this interval not displayed.    Estimated Creatinine Clearance: 17.2 mL/min (A) (by C-G formula based on SCr of 1.94 mg/dL (H)).    Allergies  Allergen Reactions  . Aspartame Tinitus    ARTIFICIAL SWEETNERS "CAUSE MY EARS TO ROAR" ARTIFICIAL SWEETNERS "CAUSE MY EARS TO ROAR" ARTIFICIAL SWEETNERS "CAUSE MY EARS TO ROAR" ARTIFICIAL SWEETNERS "CAUSE MY EARS TO ROAR"   . Aspirin Tinitus  . Chocolate Tinitus  . Cocoa Tinitus  . Atorvastatin Other (See Comments)  . Niacin Other (See Comments)    Other reaction(s): Unknown  . Sulfa Antibiotics     Other reaction(s): Unknown  . Sulfasalazine Other (See Comments)  . Amoxicillin-Pot Clavulanate Diarrhea  . Doxycycline Diarrhea and Nausea Only      Thank you for allowing pharmacy to be a part of this patient's care.  Albertina Parr, PharmD., BCPS Clinical Pharmacist Clinical phone for 10/18/19 until 5pm: [phone]

## 2019-10-19 LAB — CULTURE, BLOOD (ROUTINE X 2)
Culture: NO GROWTH
Culture: NO GROWTH
Special Requests: ADEQUATE
Special Requests: ADEQUATE

## 2019-10-19 LAB — CBC WITH DIFFERENTIAL/PLATELET
Abs Immature Granulocytes: 0.07 10*3/uL (ref 0.00–0.07)
Basophils Absolute: 0 10*3/uL (ref 0.0–0.1)
Basophils Relative: 0 %
Eosinophils Absolute: 0 10*3/uL (ref 0.0–0.5)
Eosinophils Relative: 0 %
HCT: 32.7 % — ABNORMAL LOW (ref 36.0–46.0)
Hemoglobin: 9.8 g/dL — ABNORMAL LOW (ref 12.0–15.0)
Immature Granulocytes: 1 %
Lymphocytes Relative: 9 %
Lymphs Abs: 0.7 10*3/uL (ref 0.7–4.0)
MCH: 28.2 pg (ref 26.0–34.0)
MCHC: 30 g/dL (ref 30.0–36.0)
MCV: 94.2 fL (ref 80.0–100.0)
Monocytes Absolute: 0.3 10*3/uL (ref 0.1–1.0)
Monocytes Relative: 4 %
Neutro Abs: 6.3 10*3/uL (ref 1.7–7.7)
Neutrophils Relative %: 86 %
Platelets: 274 10*3/uL (ref 150–400)
RBC: 3.47 MIL/uL — ABNORMAL LOW (ref 3.87–5.11)
RDW: 15.1 % (ref 11.5–15.5)
WBC: 7.3 10*3/uL (ref 4.0–10.5)
nRBC: 0 % (ref 0.0–0.2)

## 2019-10-19 LAB — GLUCOSE, CAPILLARY
Glucose-Capillary: 89 mg/dL (ref 70–99)
Glucose-Capillary: 165 mg/dL — ABNORMAL HIGH (ref 70–99)
Glucose-Capillary: 172 mg/dL — ABNORMAL HIGH (ref 70–99)

## 2019-10-19 MED ORDER — FLUCONAZOLE 100 MG PO TABS: 100.0000 mg | ORAL_TABLET | Freq: Every day | ORAL | 0 refills | Status: AC

## 2019-10-19 MED ORDER — GABAPENTIN 100 MG PO CAPS: 100.0000 mg | ORAL_CAPSULE | Freq: Every day | ORAL | 0 refills | Status: AC

## 2019-10-19 MED ORDER — DEXAMETHASONE 6 MG PO TABS: 6.0000 mg | ORAL_TABLET | Freq: Every day | ORAL | 0 refills | Status: AC

## 2019-10-19 MED ORDER — SERTRALINE HCL 50 MG PO TABS: 50.0000 mg | ORAL_TABLET | Freq: Every day | ORAL | 0 refills | Status: AC

## 2019-10-19 MED ORDER — MIRTAZAPINE 7.5 MG PO TABS: 7.5000 mg | ORAL_TABLET | Freq: Every day | ORAL | 0 refills | Status: AC

## 2019-10-19 MED ORDER — ALBUTEROL SULFATE HFA 108 (90 BASE) MCG/ACT IN AERS: 2.0000 | INHALATION_SPRAY | Freq: Four times a day (QID) | RESPIRATORY_TRACT | 0 refills | Status: AC | PRN

## 2019-10-19 NOTE — Progress Notes (Signed)
Occupational Therapy Treatment Patient Details Name: Tammy Wood MRN: [id number] DOB: 30-Oct-1926 Today's Date: 10/19/2019    History of present illness 86 year old female with history of recent CVA in 07/2019 (left basal ganglia infarct, received TPA, admitted at [facility]) residual right-sided weakness, dysphagia, essential hypertension, arthritis, then had a UTI in 08/2019.  She has been at [facility] facility since 09/10/2019.Patient was sent to ED for Covid pneumonia.   OT comments  RUE edema improved. Assisted pt with drinking honey thick water. Prevalon boot ordered for R foot. Spoke with daughter over the phone regarding use of positioning devices (pt has resting hand splint at facility), care of painful RUE and assistance with self care @ bed and wheel chair level. Deferred some of daughter's questions to [facility].   Follow Up Recommendations  Supervision/Assistance - 24 hour;Home health OT    Equipment Recommendations  Other (comment)(Pt previously set up with equipment)    Recommendations for Other Services      Precautions / Restrictions Precautions Precautions: Fall Precaution Comments: dense right hemi, painfail RUE and R LE/ankle       Mobility Bed Mobility Overal bed mobility: Needs Assistance             General bed mobility comments: +2 total to scoot up in bed  Transfers                      Balance                                           ADL either performed or assessed with clinical judgement   ADL   Eating/Feeding: Maximal assistance                                           Vision       Perception     Praxis      Cognition Arousal/Alertness: Awake/alert Behavior During Therapy: Flat affect Overall Cognitive Status: No family/caregiver present to determine baseline cognitive functioning                                 General Comments: Most  likely close to baseline cognitively        Exercises Exercises: Other exercises Other Exercises Other Exercises: Gentle PAssive ROM R hand; elbow and shoulder to 45/within pain tolerance   Shoulder Instructions       General Comments further assessed RUE and RLE; painful R foot with ankle ROM; positioning in verted withwith extension but able to position in dorsiflexion    Pertinent Vitals/ Pain       Pain Assessment: Faces Faces Pain Scale: Hurts little more Pain Location: RU/LE when repositioned Pain Descriptors / Indicators: Grimacing;Guarding;Discomfort Pain Intervention(s): Limited activity within patient's tolerance  Home Living                                          Prior Functioning/Environment              Frequency  Min 2X/week        Progress Toward Goals  OT Goals(current goals can now be found in the care plan section)  Progress towards OT goals: Progressing toward goals  Acute Rehab OT Goals Patient Stated Goal: to improve to go to daughter's home OT Goal Formulation: With patient/family Time For Goal Achievement: 10/30/19 Potential to Achieve Goals: Good ADL Goals Pt Will Perform Eating: with mod assist Pt Will Perform Grooming: with min assist;sitting Additional ADL Goal #1: Pt will self report improvenent in pain control RUE with intervention of positioning and edema control.  Plan Discharge plan needs to be updated    Co-evaluation                 AM-PAC OT "6 Clicks" Daily Activity     Outcome Measure   Help from another person eating meals?: A Lot Help from another person taking care of personal grooming?: A Lot Help from another person toileting, which includes using toliet, bedpan, or urinal?: Total Help from another person bathing (including washing, rinsing, drying)?: Total Help from another person to put on and taking off regular upper body clothing?: Total Help from another person to put on and  taking off regular lower body clothing?: Total 6 Click Score: 8    End of Session    OT Visit Diagnosis: Unsteadiness on feet (R26.81);Other abnormalities of gait and mobility (R26.89);Muscle weakness (generalized) (M62.81);Other symptoms and signs involving cognitive function;Feeding difficulties (R63.3);Pain;Hemiplegia and hemiparesis Hemiplegia - Right/Left: Right Hemiplegia - dominant/non-dominant: Dominant Hemiplegia - caused by: Cerebral infarction(not acute) Pain - Right/Left: Right Pain - part of body: Shoulder;Arm   Activity Tolerance Patient tolerated treatment well   Patient Left in bed;with call bell/phone within reach;with bed alarm set   Nurse Communication Mobility status        Time: [id number] OT Time Calculation (min): 12 min  Charges: OT General Charges $OT Visit: 1 Visit OT Treatments $Therapeutic Activity: 8-22 mins  Maurie Boettcher, OT/L   Acute OT Clinical Specialist [place] Pager [phone] Office [phone]    [facility] 10/19/2019, 2:09 PM

## 2019-10-19 NOTE — Progress Notes (Signed)
OT Treatment Note    10/19/19 1413  OT Visit Information  Last OT Received On 10/19/19  Assistance Needed +2  History of Present Illness 86 year old female with history of recent CVA in 07/2019 (left basal ganglia infarct, received TPA, admitted at [facility]) residual right-sided weakness, dysphagia, essential hypertension, arthritis, then had a UTI in 08/2019.  She has been at [facility] facility since 09/10/2019.Patient was sent to ED for Covid pneumonia.  Precautions  Precautions Fall  Precaution Comments dense right hemi, painfail RUE and R LE/ankle  Pain Assessment  Pain Assessment Faces  Faces Pain Scale 4  Pain Location RU/LE when repositioned  Pain Descriptors / Indicators Grimacing;Guarding;Discomfort  Pain Intervention(s) Limited activity within patient's tolerance  Cognition  Arousal/Alertness Awake/alert  Behavior During Therapy Flat affect  Overall Cognitive Status No family/caregiver present to determine baseline cognitive functioning  General Comments Most likely close to baseline cognitively  General Comments  General comments (skin integrity, edema, etc.) Pt fit with prevalon boot for R foot. Educated daughter over the phone on use of Prevalon for dorsiflexion and to reduce pressure on heel. Daughter very apreciative. Pt tolerating prevalon without complaints.  OT - End of Session  Activity Tolerance Patient tolerated treatment well  Patient left in bed;with call bell/phone within reach;with bed alarm set  Nurse Communication Mobility status  OT Assessment/Plan  OT Plan Discharge plan remains appropriate  OT Visit Diagnosis Unsteadiness on feet (R26.81);Other abnormalities of gait and mobility (R26.89);Muscle weakness (generalized) (M62.81);Other symptoms and signs involving cognitive function;Feeding difficulties (R63.3);Pain;Hemiplegia and hemiparesis  Hemiplegia - Right/Left Right  Hemiplegia - dominant/non-dominant Dominant   Hemiplegia - caused by Cerebral infarction  Pain - Right/Left Right  Pain - part of body Shoulder;Arm  OT Frequency (ACUTE ONLY) Min 2X/week  Follow Up Recommendations Supervision/Assistance - 24 hour;Home health OT  OT Equipment Other (comment)  AM-PAC OT "6 Clicks" Daily Activity Outcome Measure (Version 2)  Help from another person eating meals? 2  Help from another person taking care of personal grooming? 2  Help from another person toileting, which includes using toliet, bedpan, or urinal? 1  Help from another person bathing (including washing, rinsing, drying)? 1  Help from another person to put on and taking off regular upper body clothing? 1  Help from another person to put on and taking off regular lower body clothing? 1  6 Click Score 8  OT Goal Progression  Progress towards OT goals Progressing toward goals  Acute Rehab OT Goals  Patient Stated Goal to improve to go to daughter's home  OT Goal Formulation With patient/family  Time For Goal Achievement 10/30/19  Potential to Achieve Goals Good  ADL Goals  Pt Will Perform Eating with mod assist  Pt Will Perform Grooming with min assist;sitting  Additional ADL Goal #1 Pt will self report improvenent in pain control RUE with intervention of positioning and edema control.  OT Time Calculation  OT Start Time (ACUTE ONLY) 1110  OT Stop Time (ACUTE ONLY) 1126  OT Time Calculation (min) 16 min  OT General Charges  $OT Visit 1 Visit  OT Treatments  $Therapeutic Activity 8-22 mins  Maurie Boettcher, OT/L   Acute OT Clinical Specialist [place] Pager [phone] Office [phone]

## 2019-10-19 NOTE — Progress Notes (Addendum)
Palliative Care Progress Note  Patient seen and evaluated today. She was resting comfortably in no distress. She was able to communicate in short sentences-saying she wants to go home. Her mouth was very dry, appears to have thrush on her tounge. She took ice chips with no problem and signs of choking or difficulty-she asked for more during my visit.   I spoke with her daughter Tammy Wood and Butch Penny. They have arranged for 24/7 ATC care at home through [facility]. They have equipment. They are specifically asking for an order for home PT/OT/Speech therapy/RN to be provided through [facility] based out of [place], [place] -they have used them in the past. I discussed hospice services they understand this option and her daughters reassured me that if their mother declines at home that they would call for these services. Will see if [facility] can follow post-acute covid care and assist this patient and her family.  Added treatment for thrush today-otherwise patient can be discharged home when medically stable. Spiritual care consult.  Lane Hacker, DO Palliative Medicine [phone]

## 2019-10-19 NOTE — Discharge Summary (Signed)
Physician Discharge Summary  Tammy Wood [id number] DOB: Oct 21, 1926 DOA: 10/14/2019  PCP: Seward Carol, MD  Admit date: 10/14/2019 Discharge date: 10/19/2019  Admitted From: SNF Disposition:  home  Recommendations for Outpatient Follow-up:  1. 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  She has been at [facility] facility since 09/10/2019.  History was obtained from the patient's daughter.  Patient's daughter reported that patient got exposed and contracted Covid at the facility.  She did not have any fevers, loss of smell or taste.  She did have coughing and decreased p.o. intake.  Patient had not been eating or drinking much hence she was started on IV fluids at the facility yesterday.  Patient was sent to ED for Covid pneumonia. At the time of my encounter, somnolent, difficult to arouse.  Vital signs on the monitor stable. ABG showed pH of 7.3, PCO2 44.5, PO2 150 ED work-up/course:  In ED temp 98.6, respiratory rate 19, BP 110/46, O2 sats 96% on 2 L Sodium 138, potassium 4.5, creatinine 1.97, BUN 44 Creatinine 1.5 on 08/29/2019 per [facility] Course / Discharge diagnoses: Acute hypoxic respiratory failure due to COVID-19 viral illness /COVID-19 pneumonia-initially hypoxic requiring 5 L nasal cannula, she has been placed on remdesivir and finished 5 days while hospitalized, she was also placed on steroids, and in addition completed a course of ceftriaxone and azithromycin.  Clinically she has improved, she was able to be weaned off  to room air and her respiratory status is stable and she appears comfortable.  She will be discharged home in stable condition with 5 additional days of Decadron remaining.  Her inflammatory markers are stable/improving  COVID-19 Labs  Recent Labs    10/17/19 0235 10/18/19 0440  DDIMER 2.33* 2.64*  FERRITIN 273 120  CRP 1.5* 1.1*   Acute metabolic encephalopathy-due to COVID-19 infection, this appears to be improving Recent CVA with right-sided hemiparesis-stable, no new focal deficits.  Continue Plavix as recommended from [facility] on discharge following her stroke.  Patient will be discharged home and family has arranged 24-hour care Dysphagia, likely due to recent CVA-speech following, recommended dysphagia 1 diet with honey thick liquids, this is stable and she is doing well with that without aspiration events while hospitalized.  Speech therapy will be continued to be provided via home health Acute kidney injury chronic kidney disease stage IV-Baseline creatinine 1.5-2.0, her creatinine was as high as 2.16, possibly prerenal, has improved and has remained stable and is at 1.94 on discharge.  Continue to monitor as an outpatient.   Discharge Instructions   Allergies as of 10/19/2019      Reactions   Aspartame Tinitus   ARTIFICIAL SWEETNERS "CAUSE MY EARS TO ROAR" ARTIFICIAL SWEETNERS "CAUSE MY EARS TO ROAR" ARTIFICIAL SWEETNERS "CAUSE MY EARS TO ROAR" ARTIFICIAL SWEETNERS "CAUSE MY EARS TO ROAR"   Aspirin Tinitus   Chocolate Tinitus   Cocoa Tinitus   Atorvastatin Other (See Comments)   Niacin Other (See Comments)   Other reaction(s): Unknown   Sulfa Antibiotics    Other reaction(s): Unknown   Sulfasalazine Other (See  Comments)   Amoxicillin-pot Clavulanate Diarrhea   Doxycycline Diarrhea, Nausea Only      Medication List    STOP taking these medications   amLODipine 10 MG tablet Commonly known as: NORVASC   azithromycin 250 MG tablet Commonly known  as: ZITHROMAX     TAKE these medications   acetaminophen 325 MG tablet Commonly known as: TYLENOL Take 650 mg by mouth every 6 (six) hours as needed for mild pain.   albuterol 108 (90 Base) MCG/ACT inhaler Commonly known as: VENTOLIN HFA Inhale 2 puffs into the lungs every 6 (six) hours as needed for wheezing or shortness of breath.   ascorbic acid 250 MG tablet Commonly known as: VITAMIN C Take 500 mg by mouth 2 (two) times daily.   carvedilol 12.5 MG tablet Commonly known as: COREG Take 12.5 mg by mouth 2 (two) times daily with a meal.   clopidogrel 75 MG tablet Commonly known as: PLAVIX Take 75 mg by mouth daily.   Dermacloud Crea Apply 1 application topically 2 (two) times daily. Apply to buttocks after incontinence episode.   dexamethasone 6 MG tablet Commonly known as: DECADRON Take 1 tablet (6 mg total) by mouth daily for 5 days. x10days started 1.15.21   fluconazole 100 MG tablet Commonly known as: DIFLUCAN Take 1 tablet (100 mg total) by mouth daily. Start taking on: October 20, 2019   gabapentin 100 MG capsule Commonly known as: NEURONTIN Take 1 capsule (100 mg total) by mouth daily.   mirtazapine 7.5 MG tablet Commonly known as: REMERON Take 1 tablet (7.5 mg total) by mouth at bedtime.   ondansetron 4 MG disintegrating tablet Commonly known as: ZOFRAN-ODT Take 4 mg by mouth every 6 (six) hours as needed for nausea or vomiting.   rosuvastatin 5 MG tablet Commonly known as: CRESTOR Take 5 mg by mouth daily.   sertraline 50 MG tablet Commonly known as: ZOLOFT Take 1 tablet (50 mg total) by mouth daily.   Vitamin D3 25 MCG (1000 UT) Caps Take 1 capsule by mouth daily.   zinc gluconate 50 MG tablet Take 100 mg by mouth daily.        Consultations:  Palliative care  Procedures/Studies:  CT HEAD WO CONTRAST  Result Date: 10/14/2019 CLINICAL DATA:  86 year old female with neurologic deficit. Concern for acute infarct. EXAM: CT HEAD WITHOUT  CONTRAST TECHNIQUE: Contiguous axial images were obtained from the base of the skull through the vertex without intravenous contrast. COMPARISON:  None. FINDINGS: Brain: There is mild age-related atrophy and moderate chronic microvascular ischemic changes. An area of low attenuation in the left lentiform nucleus, likely chronic. Clinical correlation is recommended. Bilateral thalamic low attenuation with an area of decreased density along the inferior aspect of the left thalamus, age indeterminate, possibly chronic. Clinical correlation is recommended. MRI may provide better evaluation if there is high clinical concern for an acute infarct. There is no acute intracranial hemorrhage. No mass effect or midline shift. No extra-axial fluid collection. Vascular: No hyperdense vessel or unexpected calcification. Skull: Normal. Negative for fracture or focal lesion. Sinuses/Orbits: No acute finding. Other: None IMPRESSION: 1. No acute intracranial hemorrhage. 2. Moderate chronic microvascular ischemic changes. Large left lentiform nucleus infarct, likely old. Clinical correlation is recommended. Electronically Signed   By: Anner Crete M.D.   On: 10/14/2019 16:04   DG Chest Port 1 View  Result Date: 10/14/2019 CLINICAL DATA:  Shortness of breath, cough. EXAM: PORTABLE CHEST 1 VIEW COMPARISON:  None. FINDINGS: The heart size and  mediastinal contours are within normal limits. No pneumothorax or pleural effusion is noted. Mild patchy airspace opacities are noted in both lung bases concerning for multifocal pneumonia. The visualized skeletal structures are unremarkable. IMPRESSION: Mild patchy bibasilar airspace opacities are noted concerning for multifocal pneumonia. Electronically Signed   By: Marijo Conception M.D.   On: 10/14/2019 12:57   DG Shoulder Right Port  Result Date: 10/15/2019 CLINICAL DATA:  Right shoulder pain. No known injury. EXAM: PORTABLE RIGHT SHOULDER COMPARISON:  None. FINDINGS: Mild AC joint  and glenohumeral joint degenerative changes. No dislocation. No acute bony findings or bone lesion. No abnormal soft tissue calcifications. The visualized lung is clear and the visualized ribs are intact. IMPRESSION: Mild degenerative changes but no acute bony findings. Electronically Signed   By: Marijo Sanes M.D.   On: 10/15/2019 15:18      Subjective: No complaints.   Discharge Exam: BP 121/61 (BP Location: Left Arm)   Pulse 83   Temp 97.7 F (36.5 C) (Axillary)   Resp 17   Ht 5\' 2"  (1.575 m)   Wt 65.8 kg   SpO2 97%   BMI 26.52 kg/m   General: Pt is alert, awake, not in acute distress Cardiovascular: RRR, S1/S2 +, no rubs, no gallops Respiratory: CTA bilaterally, no wheezing, no rhonchi Abdominal: Soft, NT, ND, bowel sounds +   The results of significant diagnostics from this hospitalization (including imaging, microbiology, ancillary and laboratory) are listed below for reference.     Microbiology: Recent Results (from the past 240 hour(s))  Blood Culture (routine x 2)     Status: None   Collection Time: 10/14/19 12:32 PM   Specimen: BLOOD LEFT HAND  Result Value Ref Range Status   Specimen Description   Final    BLOOD LEFT HAND Performed at [facility], [place] [address], [place], [place] [address]    Special Requests   Final    BOTTLES DRAWN AEROBIC AND ANAEROBIC Blood Culture adequate volume Performed at [place] [address], [place], [place] [address]    Culture   Final    NO GROWTH 5 DAYS Performed at [facility], [place] [address], [place], [place] [address]    Report Status 10/19/2019 FINAL  Final  Blood Culture (routine x 2)     Status: None   Collection Time: 10/14/19 12:37 PM   Specimen: BLOOD  Result Value Ref Range Status   Specimen Description   Final    BLOOD LEFT ANTECUBITAL Performed at [facility] [address], [place], [place] [address]    Special Requests   Final     BOTTLES DRAWN AEROBIC AND ANAEROBIC Blood Culture adequate volume Performed at [facility] [address] [place], [place] [address]    Culture   Final    NO GROWTH 5 DAYS Performed at [facility], [place] [address], [place], [place] [address]    Report Status 10/19/2019 FINAL  Final  Urine culture     Status: Abnormal   Collection Time: 10/14/19  4:41 PM   Specimen: Urine, Random  Result Value Ref Range Status   Specimen Description   Final    URINE, RANDOM Performed at [facility] [address] [place], [place] [address]    Special Requests   Final    NONE Performed at [facility], [place] [address] [place], [place] [address]    Culture (A)  Final    >=100,000 COLONIES/mL ENTEROCOCCUS FAECALIS 50,000 COLONIES/mL KLEBSIELLA PNEUMONIAE    Report Status 10/17/2019 FINAL  Final   Organism ID, Bacteria ENTEROCOCCUS FAECALIS (A)  Final   Organism ID, Bacteria KLEBSIELLA PNEUMONIAE (A)  Final      Susceptibility   Enterococcus faecalis - MIC*    AMPICILLIN <=2 SENSITIVE Sensitive     NITROFURANTOIN <=16 SENSITIVE Sensitive     VANCOMYCIN 1 SENSITIVE Sensitive     * >=100,000 COLONIES/mL ENTEROCOCCUS FAECALIS   Klebsiella pneumoniae - MIC*    AMPICILLIN >=32 RESISTANT Resistant     CEFAZOLIN <=4 SENSITIVE Sensitive     CEFTRIAXONE <=0.25 SENSITIVE Sensitive     CIPROFLOXACIN <=0.25 SENSITIVE Sensitive     GENTAMICIN <=1 SENSITIVE Sensitive     IMIPENEM <=0.25 SENSITIVE Sensitive     NITROFURANTOIN 64 INTERMEDIATE Intermediate     TRIMETH/SULFA <=20 SENSITIVE Sensitive     AMPICILLIN/SULBACTAM 8 SENSITIVE Sensitive     PIP/TAZO <=4 SENSITIVE Sensitive     * 50,000 COLONIES/mL KLEBSIELLA PNEUMONIAE  MRSA PCR Screening     Status: None   Collection Time: 10/15/19  1:10 PM   Specimen: Nasal Mucosa; Nasopharyngeal  Result Value Ref Range Status   MRSA by PCR NEGATIVE NEGATIVE Final    Comment:        The  GeneXpert MRSA Assay (FDA approved for NASAL specimens only), is one component of a comprehensive MRSA colonization surveillance program. It is not intended to diagnose MRSA infection nor to guide or monitor treatment for MRSA infections. Performed at [facility], [place] [address] [place],  [address]      Labs: Basic Metabolic Panel: Recent Labs  Lab 10/14/19 1237 10/14/19 1959 10/16/19 0423 10/17/19 0235 10/18/19 0440  NA 138  --  138 144 144  K 4.5  --  4.4 4.4 4.6  CL 103  --  106 112* 109  CO2 25  --  23 23 23   GLUCOSE 151*  --  203* 208* 216*  BUN 44*  --  62* 71* 80*  CREATININE 1.97* 2.16* 2.06* 1.95* 1.94*  CALCIUM 8.8*  --  8.5* 8.8* 9.1   Liver Function Tests: Recent Labs  Lab 10/14/19 1237 10/16/19 0423 10/17/19 0235 10/18/19 0440  AST 31 26 29 24   ALT 16 15 19 18   ALKPHOS 72 56 58 53  BILITOT 0.5 0.6 0.3 0.9  PROT 6.6 5.2* 5.3* 5.5*  ALBUMIN 2.6* 2.1* 2.2* 2.4*   CBC: Recent Labs  Lab 10/15/19 0803 10/16/19 0423 10/17/19 0235 10/18/19 0440 10/19/19 0800  WBC 5.3 7.1 7.3 7.7 7.3  NEUTROABS 4.4 6.0 6.6 7.1 6.3  HGB 10.2* 9.3* 10.1* 9.4* 9.8*  HCT 34.7* 30.2* 33.5* 32.0* 32.7*  MCV 94.8 92.6 93.6 94.7 94.2  PLT 227 258 302 313 274   CBG: Recent Labs  Lab 10/17/19 2323 10/18/19 0741 10/18/19 1245 10/18/19 2029 10/19/19 0726  GLUCAP 151* 208* 151* 113* 165*   Hgb A1c Recent Labs    10/17/19 0235  HGBA1C 5.9*   Lipid Profile No results for input(s): CHOL, HDL, LDLCALC, TRIG, CHOLHDL, LDLDIRECT in the last 72 hours. Thyroid function studies No results for input(s): TSH, T4TOTAL, T3FREE, THYROIDAB in the last 72 hours.  Invalid input(s): FREET3 Urinalysis    Component Value Date/Time   COLORURINE YELLOW 10/14/2019 1641   APPEARANCEUR TURBID (A) 10/14/2019 1641   LABSPEC 1.017 10/14/2019 1641   PHURINE 5.0 10/14/2019 1641   GLUCOSEU NEGATIVE 10/14/2019 1641  HGBUR NEGATIVE 10/14/2019 [place] 10/14/2019 1641   KETONESUR 5 (A) 10/14/2019 1641   PROTEINUR 100 (A) 10/14/2019 1641   NITRITE NEGATIVE 10/14/2019 1641   LEUKOCYTESUR MODERATE (A) 10/14/2019 1641    FURTHER DISCHARGE INSTRUCTIONS:   Get Medicines reviewed and adjusted: Please take all your medications with you for your next visit with your Primary MD   Laboratory/radiological data: Please request your Primary MD to go over all hospital tests and procedure/radiological results at the follow up, please ask your Primary MD to get all Hospital records sent to his/her office.   In some cases, they will be blood work, cultures and biopsy results pending at the time of your discharge. Please request that your primary care M.D. goes through all the records of your hospital data and follows up on these results.   Also Note the following: If you experience worsening of your admission symptoms, develop shortness of breath, life threatening emergency, suicidal or homicidal thoughts you must seek medical attention immediately by calling 911 or calling your MD immediately  if symptoms less severe.   You must read complete instructions/literature along with all the possible adverse reactions/side effects for all the Medicines you take and that have been prescribed to you. Take any new Medicines after you have completely understood and accpet all the possible adverse reactions/side effects.    Do not drive when taking Pain medications or sleeping medications (Benzodaizepines)   Do not take more than prescribed Pain, Sleep and Anxiety Medications. It is not advisable to combine anxiety,sleep and pain medications without talking with your primary care practitioner   Special Instructions: If you have smoked or chewed Tobacco  in the last 2 yrs please stop smoking, stop any regular Alcohol  and or any Recreational drug use.   Wear Seat belts while driving.   Please note: You were cared for by a hospitalist during  your hospital stay. Once you are discharged, your primary care physician will handle any further medical issues. Please note that NO REFILLS for any discharge medications will be authorized once you are discharged, as it is imperative that you return to your primary care physician (or establish a relationship with a primary care physician if you do not have one) for your post hospital discharge needs so that they can reassess your need for medications and monitor your lab values.  Time coordinating discharge: 35 minutes  SIGNED:  Marzetta Board, MD, PhD 10/19/2019, 10:14 AM

## 2019-10-19 NOTE — Care Management Important Message (Signed)
Important Message  Patient Details  Name: Tammy Wood MRN: [id number] Date of Birth: 16-May-1927   Medicare Important Message Given:  Yes - Important Message mailed due to current National Emergency   Verbal consent obtained due to current National Emergency  Relationship to patient: Child Contact Name: Durenda Guthrie Call Date: 10/19/19  Time: 1320 Phone: [phone] Outcome: Spoke with contact Important Message mailed to: Patient address on file      Tommy Medal 10/19/2019, 1:20 PM

## 2019-10-19 NOTE — Progress Notes (Signed)
Patient vital signs remained stabled and no acute events overnight. Patient currently in bed resting.

## 2019-10-19 NOTE — Discharge Instructions (Signed)
10 Things You Can Do to Manage Your COVID-19 Symptoms at Home If you have possible or confirmed COVID-19: 1. Stay home from work and school. And stay away from other public places. If you must go out, avoid using any kind of public transportation, ridesharing, or taxis. 2. Monitor your symptoms carefully. If your symptoms get worse, call your healthcare provider immediately. 3. Get rest and stay hydrated. 4. If you have a medical appointment, call the healthcare provider ahead of time and tell them that you have or may have COVID-19. 5. For medical emergencies, call 911 and notify the dispatch personnel that you have or may have COVID-19. 6. Cover your cough and sneezes with a tissue or use the inside of your elbow. 7. Wash your hands often with soap and water for at least 20 seconds or clean your hands with an alcohol-based hand sanitizer that contains at least 60% alcohol. 8. As much as possible, stay in a specific room and away from other people in your home. Also, you should use a separate bathroom, if available. If you need to be around other people in or outside of the home, wear a mask. 9. Avoid sharing personal items with other people in your household, like dishes, towels, and bedding. 10. Clean all surfaces that are touched often, like counters, tabletops, and doorknobs. Use household cleaning sprays or wipes according to the label instructions. cdc.gov/coronavirus 03/29/2019 This information is not intended to replace advice given to you by your health care provider. Make sure you discuss any questions you have with your health care provider. Document Revised: 08/31/2019 Document Reviewed: 08/31/2019 Elsevier Patient Education  2020 Elsevier Inc.  

## 2019-10-19 NOTE — Progress Notes (Signed)
Spiritual care follow up support with pt continuing consult entered by Dr Hilma Favors.   Ms Balaguer stated recognition of chaplain.  Spoke briefly with chaplain about someone coming to get her.  Nursing reports that pt. had just spoken with daughter and plan is for pt discharge today.   Ms Bedel did not express any needs today, rather, repeated "goodbye"     Jerene Pitch, MDiv, [place], [facility]

## 2019-10-19 NOTE — Progress Notes (Signed)
Palliative Medicine RN Note: Rec'd request from Dr Hilma Favors to send referral to [facility] for continued care after discharge. Faxed information and referral form.  Marjie Skiff Tahsin Benyo, RN, BSN, [facility] Palliative Medicine Team 10/19/2019 1:55 PM Office [phone]

## 2019-11-27 DEATH — deceased

## 2021-06-01 IMAGING — CT CT HEAD W/O CM
3 series · 14 of 46 positions shown, 16 images · non-contrast
Comparison: None.

CLINICAL DATA: [AGE] female with neurologic deficit. Concern
for acute infarct.

EXAM:
CT HEAD WITHOUT CONTRAST
TECHNIQUE: Contiguous axial images were obtained from the base of the skull
through the vertex without intravenous contrast.

[Series 2: head wo · axial · 0.47mm/px · z∈[-136,-16]mm · 8 of 29 slices shown, 10 images]
[im 3/29  brain]
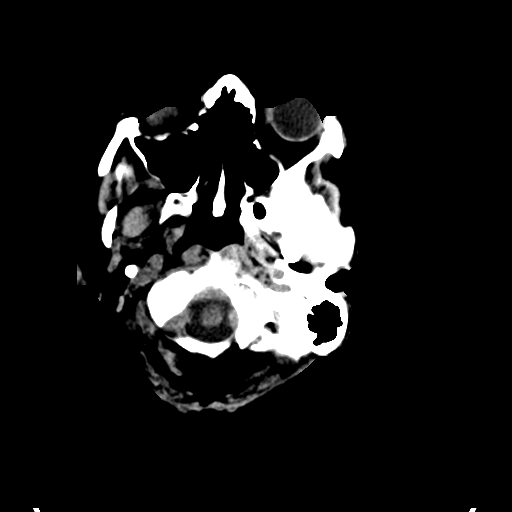
[im 3/29  bone]
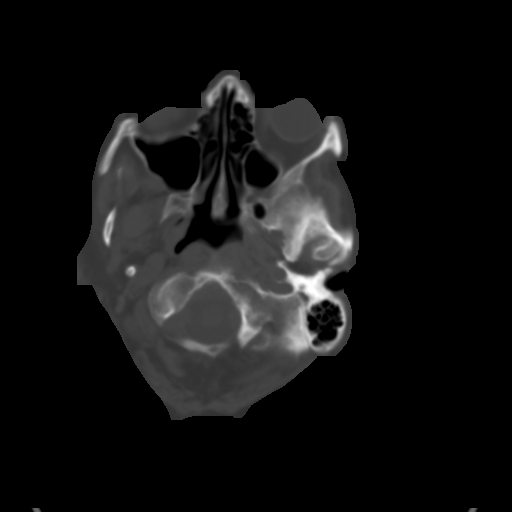
[im 7/29  brain]
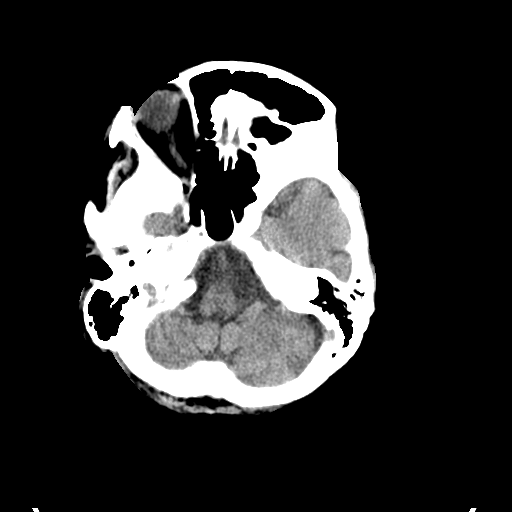
[im 10/29  brain]
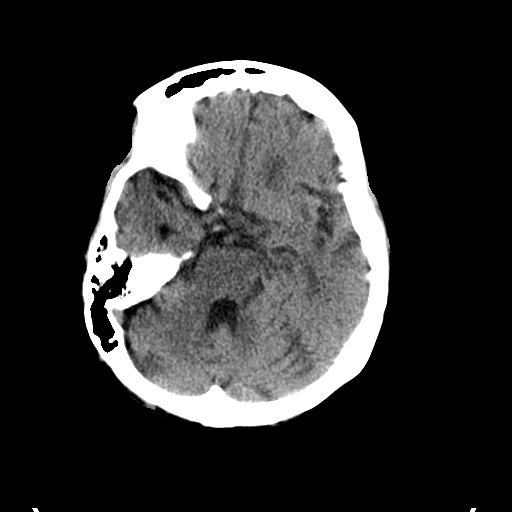
[im 13/29  brain]
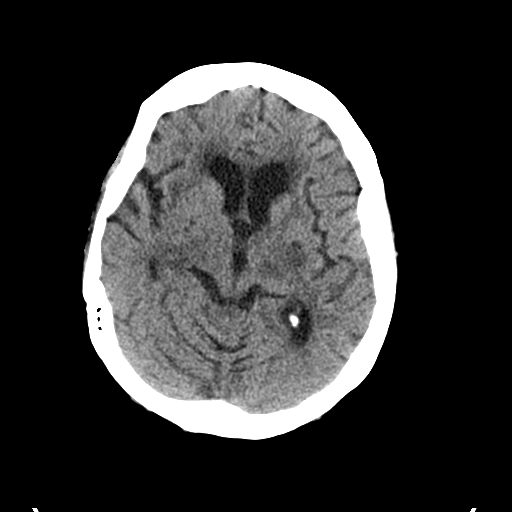
[im 17/29  brain]
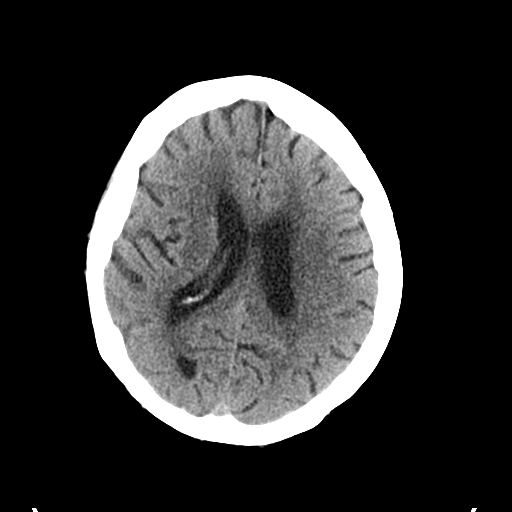
[im 17/29  bone]
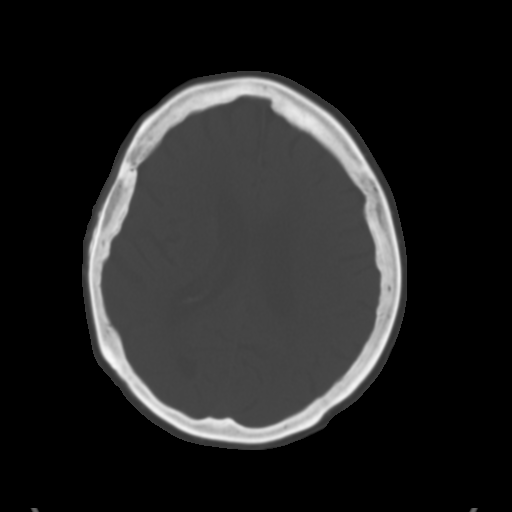
[im 20/29  brain]
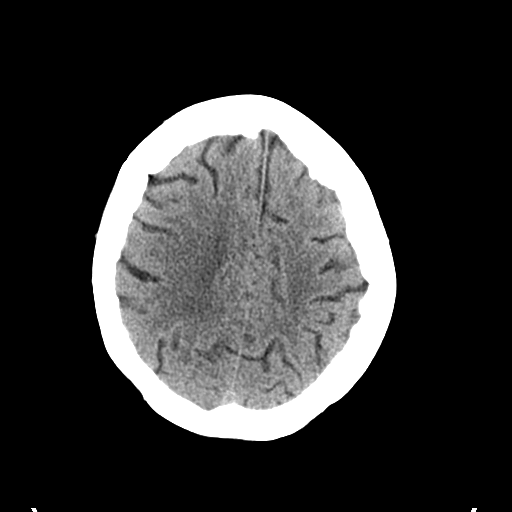
[im 23/29  brain]
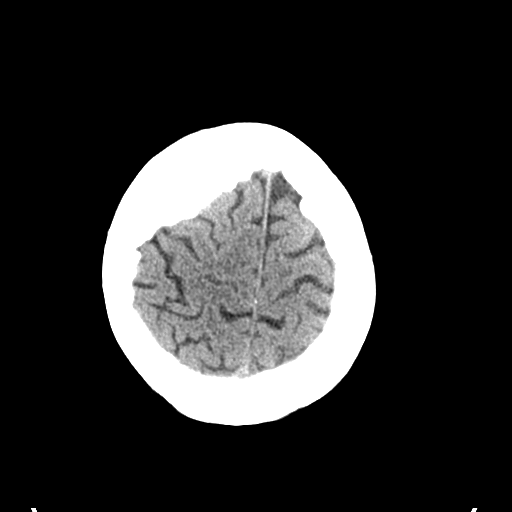
[im 27/29  brain]
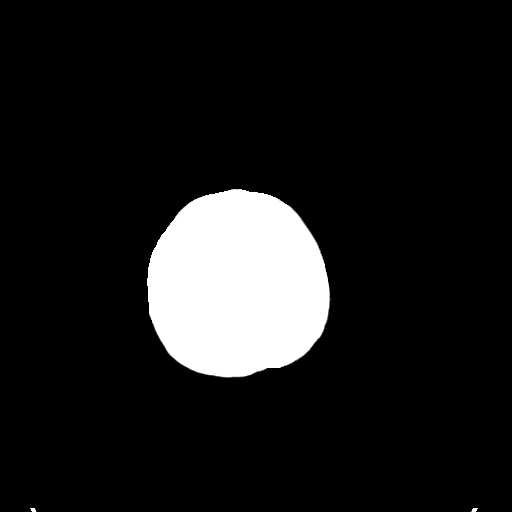

[Series 4: coronal soft tissue · coronal · 0.35mm/px · 3 of 63 slices shown]
[im 21/63  brain]
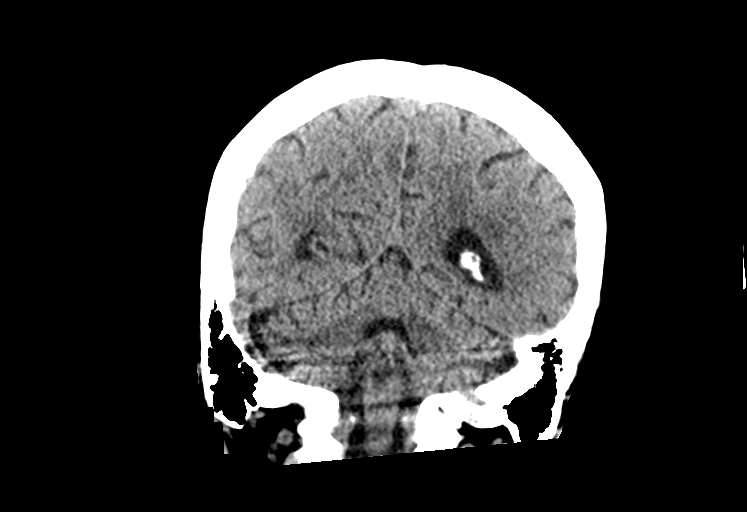
[im 28/63  brain]
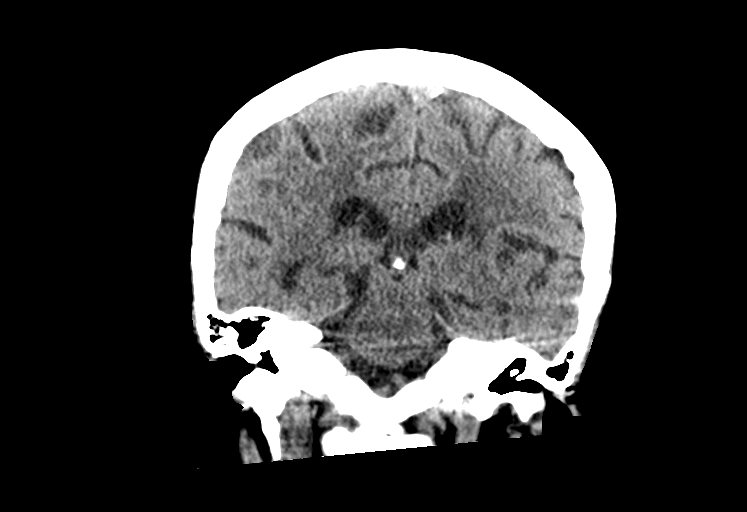
[im 35/63  brain]
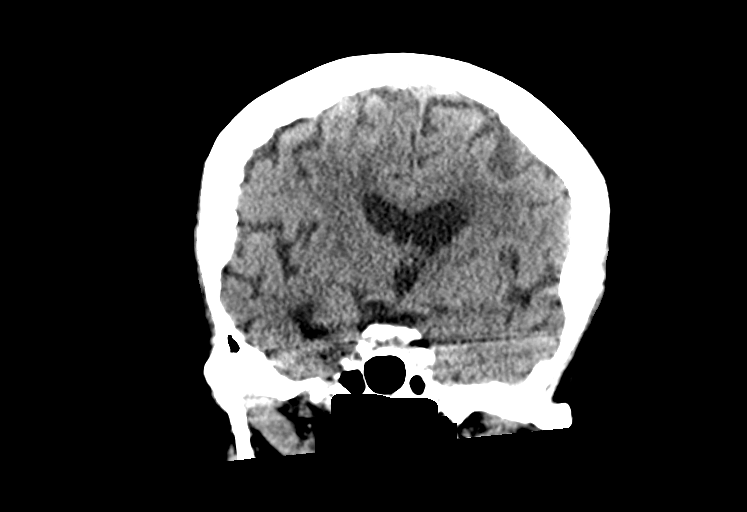

[Series 5: sagittal soft tissue · sagittal · 0.38mm/px · 3 of 49 slices shown]
[im 19/49  brain]
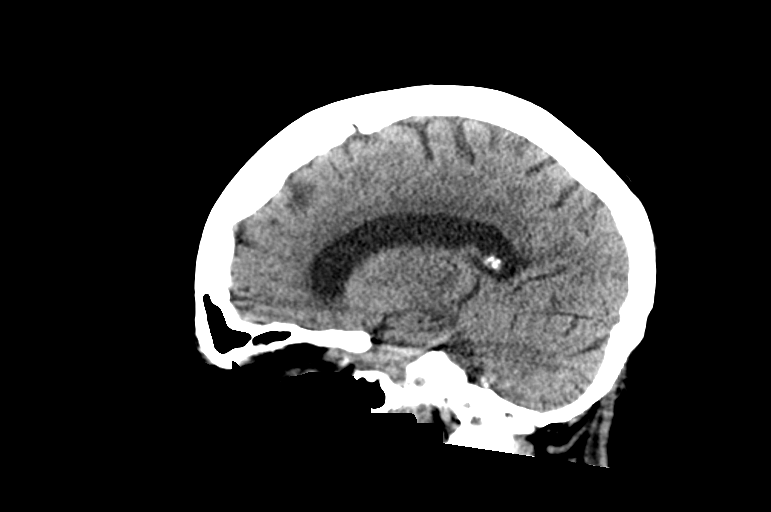
[im 25/49  brain]
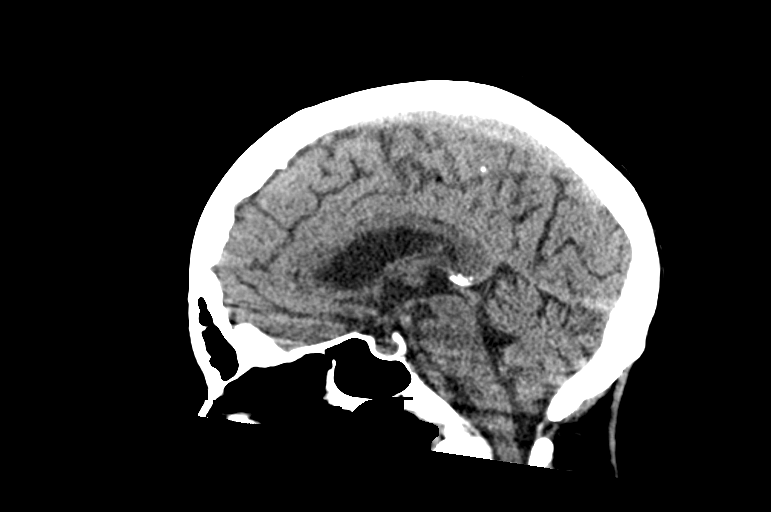
[im 30/49  brain]
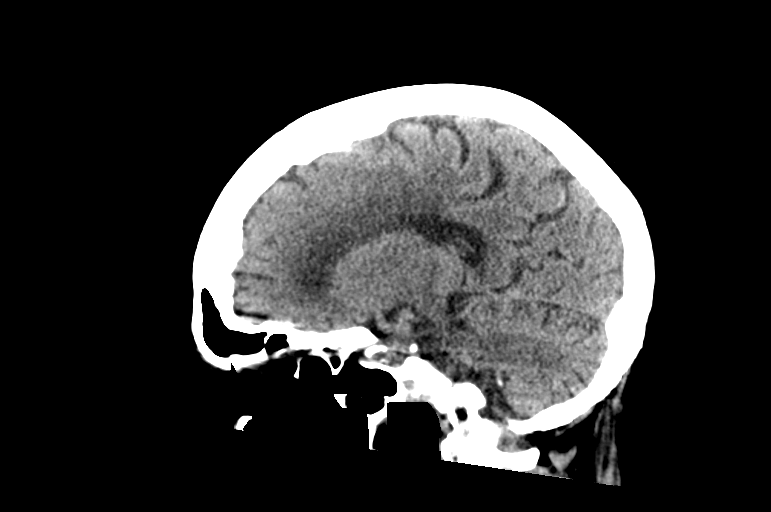

[14 of 46 positions shown; findings below may reference images not displayed]

FINDINGS: Brain: There is mild age-related atrophy and moderate chronic
microvascular ischemic changes. An area of low attenuation in the
left lentiform nucleus, likely chronic. Clinical correlation is
recommended. Bilateral thalamic low attenuation with an area of
decreased density along the inferior aspect of the left thalamus,
age indeterminate, possibly chronic. Clinical correlation is
recommended. MRI may provide better evaluation if there is high
clinical concern for an acute infarct. There is no acute
intracranial hemorrhage. No mass effect or midline shift. No
extra-axial fluid collection.

Vascular: No hyperdense vessel or unexpected calcification.

Skull: Normal. Negative for fracture or focal lesion.

Sinuses/Orbits: No acute finding.

Other: None
IMPRESSION: 1. No acute intracranial hemorrhage.
2. Moderate chronic microvascular ischemic changes. Large left
lentiform nucleus infarct, likely old. Clinical correlation is
recommended.

## 2021-06-02 IMAGING — DX DG SHOULDER 2+V PORT*R*
1 series · 2 of 2 positions shown · non-contrast
Comparison: None.

CLINICAL DATA: Right shoulder pain. No known injury.

EXAM:
PORTABLE RIGHT SHOULDER

[Series 1: shoulder · 0.14mm/px · 2 of 2 slices shown]
[im 1/2]
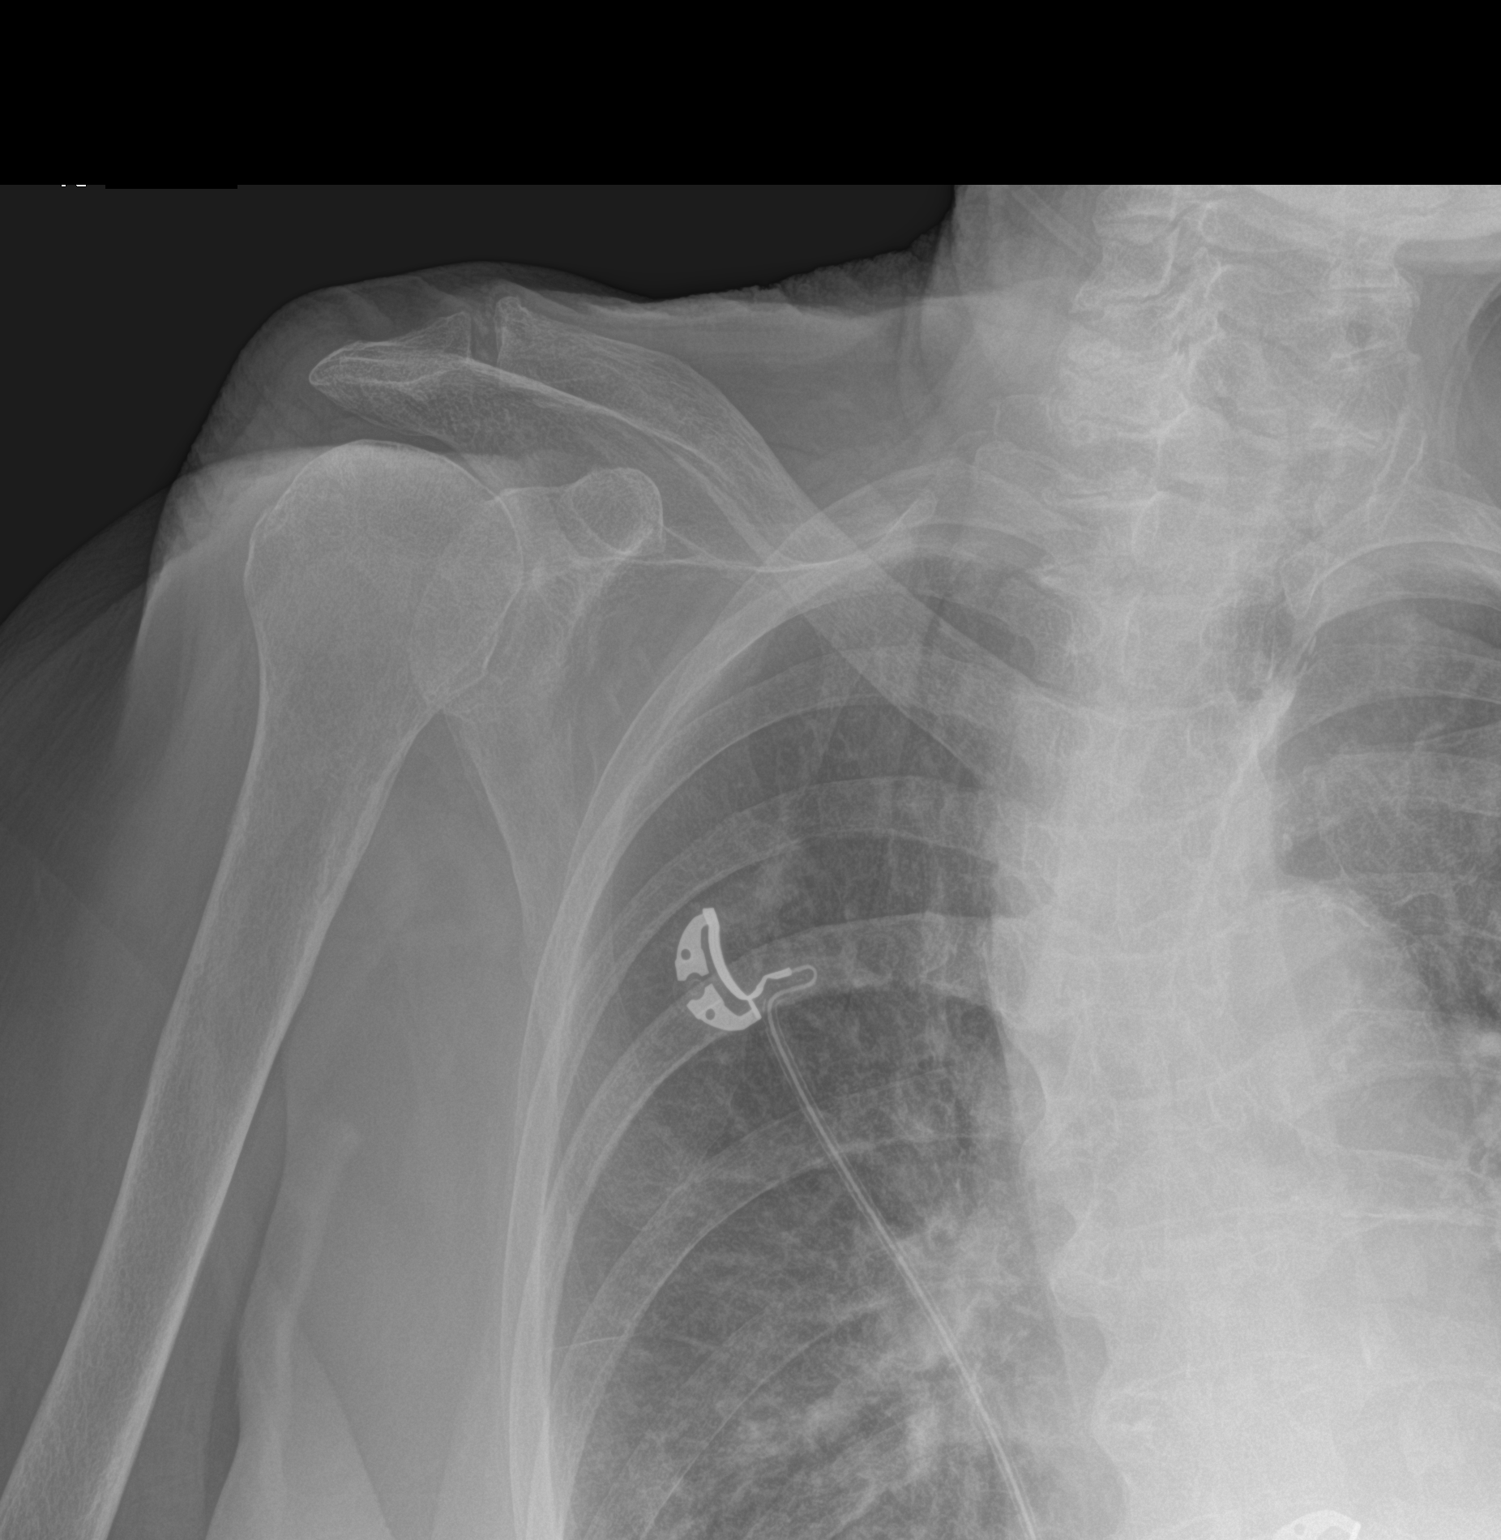
[im 2/2]
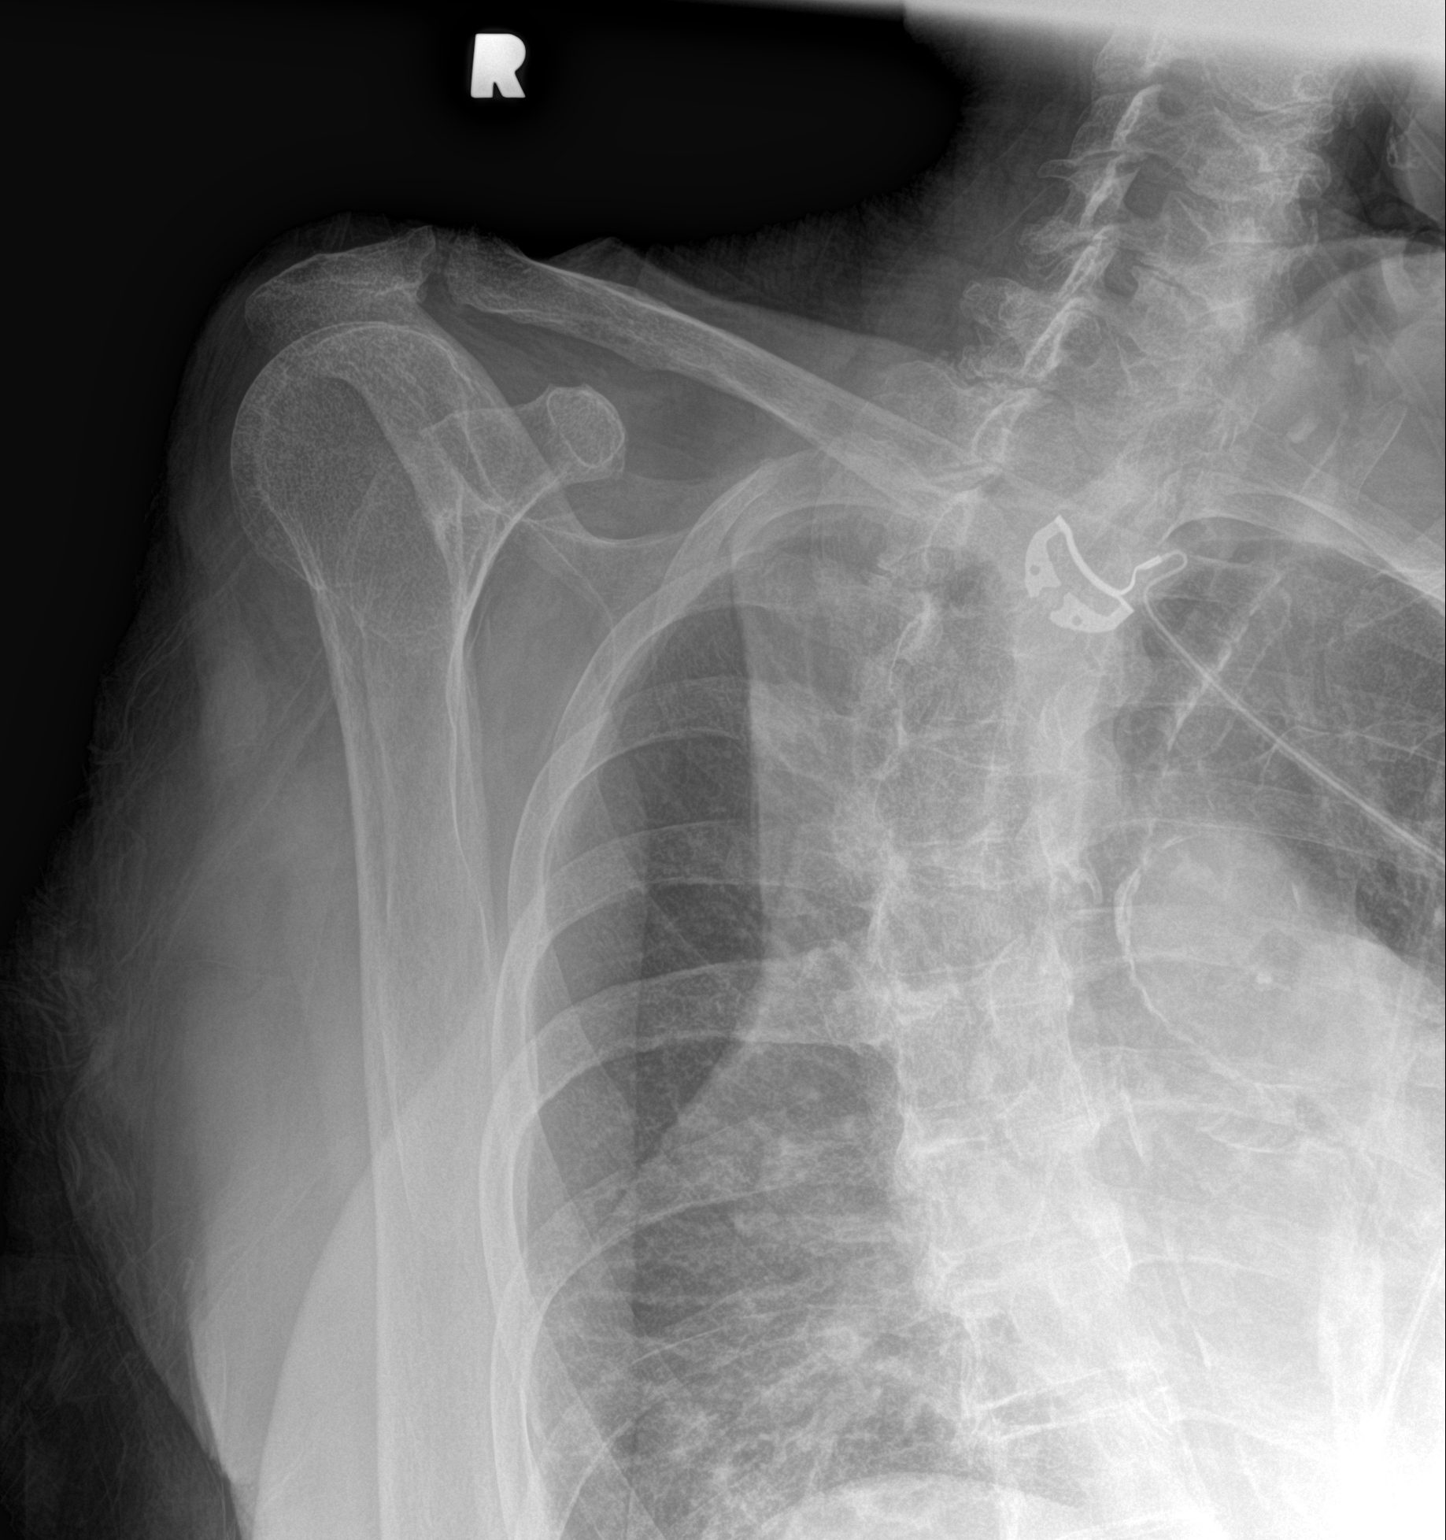

[2 of 2 positions shown; findings below may reference images not displayed]

FINDINGS: Mild AC joint and glenohumeral joint degenerative changes. No
dislocation. No acute bony findings or bone lesion. No abnormal soft
tissue calcifications. The visualized lung is clear and the
visualized ribs are intact.
IMPRESSION: Mild degenerative changes but no acute bony findings.
